# Patient Record
Sex: Male | Born: 1935 | Race: White | Hispanic: No | Marital: Married | Smoking: Never smoker
Health system: Southern US, Community
[De-identification: ages and names within clinical notes are randomized; demographics above are authoritative.]

## PROBLEM LIST (undated history)

## (undated) DIAGNOSIS — Z85828 Personal history of other malignant neoplasm of skin: Secondary | ICD-10-CM

## (undated) DIAGNOSIS — I10 Essential (primary) hypertension: Secondary | ICD-10-CM

## (undated) DIAGNOSIS — K115 Sialolithiasis: Secondary | ICD-10-CM

## (undated) DIAGNOSIS — F319 Bipolar disorder, unspecified: Secondary | ICD-10-CM

## (undated) DIAGNOSIS — M19019 Primary osteoarthritis, unspecified shoulder: Secondary | ICD-10-CM

## (undated) DIAGNOSIS — H353290 Exudative age-related macular degeneration, unspecified eye, stage unspecified: Secondary | ICD-10-CM

## (undated) DIAGNOSIS — R1310 Dysphagia, unspecified: Secondary | ICD-10-CM

## (undated) HISTORY — DX: Personal history of other malignant neoplasm of skin: Z85.828

## (undated) HISTORY — DX: Exudative age-related macular degeneration, unspecified eye, stage unspecified: H35.3290

## (undated) HISTORY — DX: Sialolithiasis: K11.5

## (undated) HISTORY — DX: Bipolar disorder, unspecified: F31.9

## (undated) HISTORY — DX: Essential (primary) hypertension: I10

## (undated) HISTORY — DX: Primary osteoarthritis, unspecified shoulder: M19.019

---

## 1966-10-17 HISTORY — PX: HEMORRHOID SURGERY: SHX153

## 2004-09-23 ENCOUNTER — Ambulatory Visit: Payer: Self-pay | Admitting: Internal Medicine

## 2004-09-30 ENCOUNTER — Ambulatory Visit: Payer: Self-pay | Admitting: Internal Medicine

## 2005-03-29 ENCOUNTER — Emergency Department (HOSPITAL_COMMUNITY): Admission: EM | Admit: 2005-03-29 | Discharge: 2005-03-29 | Payer: Self-pay | Admitting: Emergency Medicine

## 2006-02-01 ENCOUNTER — Ambulatory Visit: Payer: Self-pay | Admitting: Internal Medicine

## 2006-02-02 ENCOUNTER — Ambulatory Visit: Payer: Self-pay | Admitting: Internal Medicine

## 2006-02-08 ENCOUNTER — Ambulatory Visit: Payer: Self-pay | Admitting: Internal Medicine

## 2006-02-23 ENCOUNTER — Encounter: Payer: Self-pay | Admitting: Emergency Medicine

## 2006-02-24 ENCOUNTER — Ambulatory Visit: Payer: Self-pay | Admitting: Endocrinology

## 2006-02-24 ENCOUNTER — Inpatient Hospital Stay (HOSPITAL_COMMUNITY): Admission: EM | Admit: 2006-02-24 | Discharge: 2006-02-25 | Payer: Self-pay | Admitting: Internal Medicine

## 2006-02-25 ENCOUNTER — Ambulatory Visit: Payer: Self-pay | Admitting: *Deleted

## 2006-02-25 ENCOUNTER — Inpatient Hospital Stay (HOSPITAL_COMMUNITY): Admission: AD | Admit: 2006-02-25 | Discharge: 2006-03-07 | Payer: Self-pay | Admitting: *Deleted

## 2006-03-20 ENCOUNTER — Ambulatory Visit (HOSPITAL_COMMUNITY): Payer: Self-pay | Admitting: Psychiatry

## 2006-03-21 ENCOUNTER — Ambulatory Visit: Payer: Self-pay | Admitting: Internal Medicine

## 2006-04-05 ENCOUNTER — Ambulatory Visit (HOSPITAL_COMMUNITY): Payer: Self-pay | Admitting: Psychiatry

## 2006-04-07 ENCOUNTER — Ambulatory Visit: Payer: Self-pay | Admitting: Internal Medicine

## 2006-05-01 ENCOUNTER — Ambulatory Visit (HOSPITAL_COMMUNITY): Payer: Self-pay | Admitting: Psychiatry

## 2006-05-18 ENCOUNTER — Ambulatory Visit: Payer: Self-pay | Admitting: Internal Medicine

## 2006-06-12 ENCOUNTER — Ambulatory Visit (HOSPITAL_COMMUNITY): Payer: Self-pay | Admitting: Psychiatry

## 2006-07-17 ENCOUNTER — Ambulatory Visit (HOSPITAL_COMMUNITY): Payer: Self-pay | Admitting: Psychiatry

## 2006-07-31 ENCOUNTER — Ambulatory Visit (HOSPITAL_COMMUNITY): Payer: Self-pay | Admitting: Psychiatry

## 2006-09-25 ENCOUNTER — Ambulatory Visit (HOSPITAL_COMMUNITY): Payer: Self-pay | Admitting: Psychiatry

## 2006-10-25 ENCOUNTER — Ambulatory Visit (HOSPITAL_COMMUNITY): Payer: Self-pay | Admitting: Psychiatry

## 2006-11-15 ENCOUNTER — Ambulatory Visit (HOSPITAL_COMMUNITY): Payer: Self-pay | Admitting: Psychiatry

## 2006-12-27 ENCOUNTER — Ambulatory Visit (HOSPITAL_COMMUNITY): Payer: Self-pay | Admitting: Psychiatry

## 2007-02-28 ENCOUNTER — Ambulatory Visit (HOSPITAL_COMMUNITY): Payer: Self-pay | Admitting: Psychiatry

## 2007-03-14 ENCOUNTER — Ambulatory Visit (HOSPITAL_COMMUNITY): Payer: Self-pay | Admitting: Psychiatry

## 2007-04-25 ENCOUNTER — Ambulatory Visit (HOSPITAL_COMMUNITY): Payer: Self-pay | Admitting: Psychiatry

## 2007-06-27 ENCOUNTER — Ambulatory Visit (HOSPITAL_COMMUNITY): Payer: Self-pay | Admitting: Psychiatry

## 2007-07-16 ENCOUNTER — Encounter: Payer: Self-pay | Admitting: *Deleted

## 2007-07-16 DIAGNOSIS — F319 Bipolar disorder, unspecified: Secondary | ICD-10-CM

## 2007-07-16 DIAGNOSIS — I10 Essential (primary) hypertension: Secondary | ICD-10-CM | POA: Insufficient documentation

## 2007-08-29 ENCOUNTER — Ambulatory Visit (HOSPITAL_COMMUNITY): Payer: Self-pay | Admitting: Psychiatry

## 2007-10-24 ENCOUNTER — Ambulatory Visit (HOSPITAL_COMMUNITY): Payer: Self-pay | Admitting: Psychiatry

## 2007-12-19 ENCOUNTER — Ambulatory Visit (HOSPITAL_COMMUNITY): Payer: Self-pay | Admitting: Psychiatry

## 2009-04-18 ENCOUNTER — Ambulatory Visit: Payer: Self-pay | Admitting: Diagnostic Radiology

## 2009-04-18 ENCOUNTER — Emergency Department (HOSPITAL_BASED_OUTPATIENT_CLINIC_OR_DEPARTMENT_OTHER): Admission: EM | Admit: 2009-04-18 | Discharge: 2009-04-18 | Payer: Self-pay | Admitting: Emergency Medicine

## 2009-08-25 ENCOUNTER — Ambulatory Visit: Payer: Self-pay | Admitting: Internal Medicine

## 2009-08-27 DIAGNOSIS — M19019 Primary osteoarthritis, unspecified shoulder: Secondary | ICD-10-CM | POA: Insufficient documentation

## 2009-08-27 DIAGNOSIS — K115 Sialolithiasis: Secondary | ICD-10-CM

## 2009-09-22 ENCOUNTER — Ambulatory Visit: Payer: Self-pay | Admitting: Internal Medicine

## 2009-10-21 ENCOUNTER — Ambulatory Visit: Payer: Self-pay | Admitting: Internal Medicine

## 2010-11-16 NOTE — Assessment & Plan Note (Signed)
Summary: 2 WEEK FOLLOW UP FOR BP CHECK-LB    Vital Signs:  Patient profile:   77 year old male Height:      65 inches Weight:      125 pounds BMI:     20.88 O2 Sat:      98 % on Room air Temp:     98.3 degrees F oral Pulse rate:   76 / minute BP sitting:   168 / 78  (left arm) Cuff size:   regular  Vitals Entered By: Ami Bullins CMA (October 21, 2009 11:31 AM)  O2 Flow:  Room air CC: follow-up visit/ ab   Primary Care Provider:  Illene Regulus  CC:  follow-up visit/ ab.  History of Present Illness: For BP follow-up: December 7th he had norvasc increased to 10mg  and added furosemide 40mg . Better control  Current Medications (verified): 1)  Furosemide 40 Mg Tabs (Furosemide) .Marland Kitchen.. 1 By Mouth Once Daily - A Loop Diuretic For Blood Pressure 2)  Amlodipine Besylate 10 Mg Tabs (Amlodipine Besylate) .Marland Kitchen.. 1 By Mouth Once Daily Calcium Channel Blocker For Blood Pressure 3)  Multivitamins   Tabs (Multiple Vitamin) .... Once Daily 4)  Fa-Vitamin B-6-Vitamin B-12 2.2-25-0.5 Mg  Tabs (Folic Acid-Vit B6-Vit B12) .... Once Daily 5)  Garlic 1000 Mg  Caps (Garlic) .... Two Times A Day 6)  Cvs Zinc 50 Mg  Tabs (Zinc) .... Two Times A Day 7)  Selenium 100 Mcg  Tabs (Selenium) .... Once Daily 8)  Cvs Vitamin E 800 Unit  Caps (Vitamin E) .... Once Daily 9)  Vitamin C-Acerola 500 Mg  Chew (Ascorbic Acid) .... 2000mg  Once Daily 10)  Divalproex Sodium 500 Mg Xr24h-Tab (Divalproex Sodium) .Marland Kitchen.. 1 Tab At Bedtime  Allergies (verified): No Known Drug Allergies PMH-FH-SH reviewed-no changes except otherwise noted  Physical Exam  General:  Well-developed,well-nourished, thin,in no acute distress; alert,appropriate and cooperative throughout examination Head:  Normocephalic and atraumatic without obvious abnormalities. No apparent alopecia or balding. Lungs:  normal respiratory effort and normal breath sounds.   Heart:  normal rate and regular rhythm.     Impression & Recommendations:  Problem #  1:  HYPERTENSION (ICD-401.9)  His updated medication list for this problem includes:    Furosemide 40 Mg Tabs (Furosemide) .Marland Kitchen... 1 by mouth once daily - a loop diuretic for blood pressure    Amlodipine Besylate 10 Mg Tabs (Amlodipine besylate) .Marland Kitchen... 1 by mouth once daily calcium channel blocker for blood pressure  BP today: 168/78 Prior BP: 172/100 (09/22/2009)  Plan - continue present meds. Patient to monitor at home and report back.   Complete Medication List: 1)  Furosemide 40 Mg Tabs (Furosemide) .Marland Kitchen.. 1 by mouth once daily - a loop diuretic for blood pressure 2)  Amlodipine Besylate 10 Mg Tabs (Amlodipine besylate) .Marland Kitchen.. 1 by mouth once daily calcium channel blocker for blood pressure 3)  Multivitamins Tabs (Multiple vitamin) .... Once daily 4)  Fa-vitamin B-6-vitamin B-12 2.2-25-0.5 Mg Tabs (Folic acid-vit b6-vit b12) .... Once daily 5)  Garlic 1000 Mg Caps (Garlic) .... Two times a day 6)  Cvs Zinc 50 Mg Tabs (Zinc) .... Two times a day 7)  Selenium 100 Mcg Tabs (Selenium) .... Once daily 8)  Cvs Vitamin E 800 Unit Caps (Vitamin e) .... Once daily 9)  Vitamin C-acerola 500 Mg Chew (Ascorbic acid) .... 2000mg  once daily 10)  Divalproex Sodium 500 Mg Xr24h-tab (Divalproex sodium) .Marland Kitchen.. 1 tab at bedtime  Patient Instructions: 1)  Blood pressure -  too high in the office today. If it is running lower at home I would like a record of that: check your Blood pressure 1 or 2 times a day at different times - record those values and report the information back to me.

## 2011-01-24 LAB — BASIC METABOLIC PANEL
BUN: 16 mg/dL (ref 6–23)
Calcium: 9.6 mg/dL (ref 8.4–10.5)
Chloride: 96 mEq/L (ref 96–112)
GFR calc non Af Amer: >60 mL/min (ref >60)
Potassium: 4 mEq/L (ref 3.5–5.1)

## 2011-01-24 LAB — URINALYSIS, ROUTINE W REFLEX MICROSCOPIC
Glucose, UA: NEGATIVE mg/dL
Hgb urine dipstick: NEGATIVE
Protein, ur: NEGATIVE mg/dL
Specific Gravity, Urine: 1.006 (ref 1.005–1.030)
Urobilinogen, UA: 0.2 mg/dL (ref 0.0–1.0)

## 2011-01-24 LAB — POCT TOXICOLOGY PANEL

## 2011-01-24 LAB — CBC
HCT: 42.9 % (ref 39.0–52.0)
Hemoglobin: 14.4 g/dL (ref 13.0–17.0)
MCHC: 33.5 g/dL (ref 30.0–36.0)
MCV: 91 fL (ref 78.0–100.0)
RDW: 12.9 % (ref 11.5–15.5)

## 2011-01-24 LAB — DIFFERENTIAL
Eosinophils Absolute: 0.1 10*3/uL (ref 0.0–0.7)
Eosinophils Relative: 1 % (ref 0–5)
Lymphocytes Relative: 6 % — ABNORMAL LOW (ref 12–46)
Lymphs Abs: 0.7 10*3/uL (ref 0.7–4.0)
Monocytes Relative: 5 % (ref 3–12)

## 2011-01-24 LAB — ETHANOL: Alcohol, Ethyl (B): <5 mg/dL (ref 0–10)

## 2011-02-10 ENCOUNTER — Other Ambulatory Visit (INDEPENDENT_AMBULATORY_CARE_PROVIDER_SITE_OTHER): Payer: Medicare Other

## 2011-02-10 ENCOUNTER — Ambulatory Visit (INDEPENDENT_AMBULATORY_CARE_PROVIDER_SITE_OTHER): Payer: Medicare Other | Admitting: Internal Medicine

## 2011-02-10 ENCOUNTER — Encounter: Payer: Self-pay | Admitting: Internal Medicine

## 2011-02-10 VITALS — BP 208/82 | HR 66 | Temp 97.9°F | Wt 136.0 lb

## 2011-02-10 DIAGNOSIS — I10 Essential (primary) hypertension: Secondary | ICD-10-CM

## 2011-02-10 LAB — COMPREHENSIVE METABOLIC PANEL
ALT: 13 U/L (ref 0–53)
Albumin: 3.9 g/dL (ref 3.5–5.2)
CO2: 30 mEq/L (ref 19–32)
Calcium: 9.5 mg/dL (ref 8.4–10.5)
Chloride: 97 mEq/L (ref 96–112)
GFR: 152.17 mL/min (ref >60.00)
Glucose, Bld: 101 mg/dL — ABNORMAL HIGH (ref 70–99)
Sodium: 134 mEq/L — ABNORMAL LOW (ref 135–145)
Total Protein: 6.7 g/dL (ref 6.0–8.3)

## 2011-02-10 MED ORDER — AMLODIPINE BESYLATE 10 MG PO TABS: 10.0000 mg | ORAL_TABLET | Freq: Every day | ORAL | Status: DC

## 2011-02-10 MED ORDER — FUROSEMIDE 40 MG PO TABS: 40.0000 mg | ORAL_TABLET | Freq: Every day | ORAL | Status: DC

## 2011-02-10 NOTE — Progress Notes (Signed)
  Subjective:    Patient ID: [name], male    DOB: 1928-10-05, 77 y.o.   MRN: [id number]  HPIMr. [name] is seen acutely due to very high blood pressure at the dermatologist office 220/140. He was asymptomatic. He admits that he hasn't been taking any blood pressure medication for two + months. At home he does run a blood pressure of 150-180. By his recollection he was not having any adverse effects from the medication.   He has been treated for glaucoma with intra-orbital injections. He has also been having multiple skin lesions excised.   Past Medical History  Diagnosis Date  . History of skin cancer   . Sialolithiasis   . Osteoarthrosis, unspecified whether generalized or localized, shoulder region   . Bipolar disorder, unspecified   . Unspecified essential hypertension   . Macular degeneration, age related, exudative     revieving intra-ocular injections left eye   Past Surgical History  Procedure Date  . Hemorrhoid surgery 1968   Family History  Problem Relation Age of Onset  . Lymphoma Mother   . Colon cancer Sister   . COPD Sister   . Diabetes Neg Hx   . Hyperlipidemia Neg Hx   . Heart disease Neg Hx   . Cancer Sister     colon cancer   History   Social History  . Marital Status: Married    Spouse Name: N/A    Number of Children: 1  . Years of Education: 16   Occupational History  . Programmer, systems   Social History Main Topics  . Smoking status: Never Smoker   . Smokeless tobacco: Not on file  . Alcohol Use: 3.5 oz/week    7 drink(s) per week  . Drug Use: Not on file  . Sexually Active: No   Other Topics Concern  . Not on file   Social History Narrative   [employer]. College grad-[employer] [place]. Married '52. 1 son - '65.  Retired.End of Life issues - provided packet today (02/10/11)       Review of Systems Review of Systems  Constitutional:  Negative for fever, chills, activity change and unexpected weight change.    HENT:  Negative for hearing loss, ear pain, congestion, neck stiffness and postnasal drip.   Eyes: Negative for pain, discharge and visual disturbance.  Respiratory: Negative for chest tightness and wheezing.   Cardiovascular: Negative for chest pain and palpitations.       [No decreased exercise tolerance Gastrointestinal: [No change in bowel habit. No bloating or gas. No reflux or indigestion Genitourinary: Negative for urgency, frequency, flank pain and difficulty urinating.  Musculoskeletal: Negative for myalgias, back pain, arthralgias and gait problem.  Neurological: Negative for dizziness, tremors, weakness and headaches.  Hematological: Negative for adenopathy.  Psychiatric/Behavioral: Negative for behavioral problems and dysphoric mood.       Objective:   Physical Exam Elderly white male in no distress HEENT - Noble/at, C&S clear Chest clear to AP Cor - RRR, no carotid bruits Neuro - non-focal.       Assessment & Plan:  1. Hypertension - patient with out of control hypertension but he is asymptomatic.  Plan - resume medications: amlodipine 10mg  qd and furosemide 40mg            Return for BP check.

## 2011-02-11 ENCOUNTER — Encounter: Payer: Self-pay | Admitting: Internal Medicine

## 2011-03-04 NOTE — H&P (Signed)
[name], [name] NO.:  [id number]   MEDICAL RECORD NO.:  [id number]          PATIENT TYPE:  EMS   LOCATION:  ED                           FACILITY:  [facility]   PHYSICIAN:  Rod Holler, MD      DATE OF BIRTH:  03-Nov-1927   DATE OF ADMISSION:  02/23/2006  DATE OF DISCHARGE:                                HISTORY & PHYSICAL   CHIEF COMPLAINT:  Not sleeping well.   HISTORY OF PRESENT ILLNESS:  [name] is a 77 year old male with a history  of hypertension, possible bipolar disorder, who presented to the ED  reportedly with delusions and paranoid behavior.  By his report, he is under  a lot of stressors, not sleeping well, not eating well.  The patient states  he is not homicidal or suicidal but state that someone is trying to hurt  him.  He denies audible or visual hallucinations.  In the emergency  department, the patient would not take any p.o. medications.  He was trying  to leave the hospital against medical advice, was restrained and committed  to the hospital for psychiatric evaluation.   In the emergency department, the patient had a CT scan of the head with no  acute findings but did have evidence of small vessel disease, chronic  sinusitis as well.  In the emergency department, the patient was given  Ativan.  Initial blood pressure were 199/102 and he was given labetalol and  a Clonidine patch.  His blood pressure now is 150/90s.  The patient has no  complaints of chest pain or shortness of breath.   PAST MEDICAL HISTORY:  1.  Hypertension.  2.  Bipolar disorder.   MEDICATIONS:  None.   ALLERGIES:  No known drug allergies.   SOCIAL HISTORY:  The patient lives in [place].  He is a Psychologist, occupational.  No  tobacco use.   FAMILY HISTORY:  Mother died of cancer.  Father died of old age.   REVIEW OF SYSTEMS:  All systems reviewed in detail and are negative except  as noted in the history of present illness.   PHYSICAL EXAMINATION:  VITAL SIGNS:  Blood  pressure 150s/90s, heart rate in  the 70s, respiratory rate 20, temperature 99.0, oxygen saturation 98% on  room air.  GENERAL:  Thin, elderly male, alert and oriented x3, in no acute respiratory  distress.  HEENT:  Normocephalic and atraumatic.  Pupils are equal, round, and reactive  to M[place].  Extraocular movements intact.  Oropharynx clear.  NECK:  Supple.  No adenopathy, no JVD, no carotid bruits.  CHEST:  Lungs clear to auscultation bilaterally with equal bilateral breath  sounds.  CORONARY:  Regular rhythm, normal rate, normal S1 and S2.  No murmurs,  gallops, or rubs.  2+ peripheral pulses.  ABDOMEN:  Soft, nontender, and nondistended.  Active bowel sounds.  No  hepatosplenomegaly.  EXTREMITIES:  No clubbing, cyanosis, or edema.  NEUROLOGICAL:  No focal deficits.  SKIN:  No rashes.  PSYCHIATRIC:  The patient is alert and oriented.  Moves all extremities  well.  Has a verbal stream of consciousness.   LABORATORY DATA:  White blood cell count of 5.4, hematocrit 40.2, platelets  283,000.  Sodium 134, potassium 3.2, chloride 97, bicarb 25, BUN 14,  creatinine 0.5, glucose 153.   IMPRESSION:  [name] is a 77 year old male who presents with psychosis,  along with hypertension.   PLAN:  1.  Psychiatric:  The patient has already been committed for psychiatric      evaluation.  We will get a sitter at the bedside and give him Ativan or      Haldol p.r.n.  2.  Cardiovascular:  EKG in the morning, Clonidine patch 0.1 mg, hydralazine      p.r.n.  3.  Endocrine:  Thyroid function tests in the morning.  Hemoglobin A1c in      the morning.  4.  CMP in the morning.  5.  Fluids/electrolytes/nutrition:  The patient has had a potassium      repletion in the emergency department.  We will recheck potassium in the      morning, place the patient on a regular diet.      Rod Holler, MD  Electronically Signed     TRK/MEDQ  D:  02/23/2006  T:  02/23/2006  Job:  [id number]
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
## 2011-03-04 NOTE — Discharge Summary (Signed)
[name], [name] NO.:  [id number]   MEDICAL RECORD NO.:  [id number]          PATIENT TYPE:  EMS   LOCATION:  ED                           FACILITY:  [facility]   PHYSICIAN:  Sean A. Everardo All, M.D. [facility] OF BIRTH:  1928/06/21   DATE OF ADMISSION:  02/23/2006  DATE OF DISCHARGE:  02/23/2006                                 DISCHARGE SUMMARY   REASON FOR ADMISSION:  Paranoia.   HISTORY OF PRESENT ILLNESS:  This 77 year old man admitted by Dr. Samuel Bouche on  Feb 23, 2006.  Please refer to his dictated history and physical for  details.   HOSPITAL COURSE:  The patient was admitted and blood pressure medication was  started. His potassium was repleted. He had laboratory studies including  thyroid function studies which are normal. He was evaluated by psychiatry  who felt that he was not competent and that he was an immediate threat to  himself and/or others. I have personally seen and examined him today.  On  examination today, he demands to know why a call button [place] is on in his  room. He also refused to shake my hand, saying he was contaminated. His  wife describes him as being extremely depressed and detached from reality  as well as paranoid.  I am thus certifying in the medical record that he is  incompetent to make medical decisions and is an immediate threat to himself  and/or others. He is currently being evaluated for a transfer to [facility]   MEDICATIONS AT TIME OF TRANSFER:  1.  Catapres patch - TTS - one weekly.  2.  Haldol 2 to 5 mg p.o. or IM every 4 hours p.r.n. acute agitation.  3.  Ativan 1 to 2 mg p.o. or IM or IV every 4 hours p.r.n. for agitation.  4.  Cogentin 2 mg IV b.i.d. p.r.n. for acute muscle stiffness (acute      dystonia).  5.  Zyprexa 5 mg p.o. or IM q.h.s.   DIET:  No restriction on diet.   ACTIVITY:  Is ad lib except he must stay in his room and he has a Comptroller.           ______________________________  Cleophas Dunker. Everardo All, M.D. [facility]     SAE/MEDQ  D:  02/24/2006  T:  02/25/2006  Job:  [id number]

## 2011-03-04 NOTE — Discharge Summary (Signed)
NAMEJERRIT, [name] NO.:  [id number]   MEDICAL RECORD NO.:  [id number]          PATIENT TYPE:  IPS   LOCATION:  0403                          FACILITY:  [facility]   PHYSICIAN:  Jasmine Pang, M.D. DATE OF BIRTH:  1928-09-20   DATE OF ADMISSION:  02/25/2006  DATE OF DISCHARGE:  03/07/2006                                 DISCHARGE SUMMARY   IDENTIFICATION:  This was an involuntary admission to my service for this 77-  year-old married Caucasian male.  He was admitted on Feb 25, 2006.   HISTORY OF PRESENT ILLNESS:  The patient's wife brought him to the emergency  department at [facility] yesterday.  The patient stated he was  depressed.  He had not slept in weeks and he thought someone was going to  kill him.  He was noted to be very paranoid.  He was attempted to be  medically cleared.  However, his blood pressure was elevated.  His potassium  was decreased and hence he was kept overnight.  He had to be restrained due  to refusing all medical treatment.  He was given IV potassium and Ativan 1  mg as well as his blood pressure medications.  She was transferred over to  the [facility] as it was felt he was medically stable.  On the  day of admission, he was appearing quite paranoid.  Apparently, there had  been some work done in his home.  He got into a confrontation with the  contractor.  He believed that bombs had been planted in his home in vehicles  by the construction workers he hired.  He had not been eating because he  thought the food was poisoned and apparently his wife reported he had been  impulsively spending excessive amounts of money.  The patient was noted to  have a watchful guarded affect.  He was peeping out of the room and down the  hall.  He stated he was in the hospital because of my blood pressure.   Upon admission, the patient was alert but fearful.  He had poor eye contact.  His appearance was somewhat unkempt.  He looked  like he was malnourished.  His speech was not pressured.  His mood was anxious but somewhat labile.  His thought processes were concrete.  Cognitive exam:  Memory was good.  His  concentration was a little bit decreased.  He denies responding to internal  stimuli, however, his behavior would suggest otherwise.  He reported  suicidal ideation with no plan.  However, he denied that on the day of  admission.  He has never had homicidal ideation.  On the day before  admission, he reported that booger people were watching him and his  family.  He was not eating because he thought the food was poisoned.  He  also acknowledged that he had not slept in two minutes.  The admission  diagnosis was psychotic disorder not otherwise specified.   SOCIAL HISTORY:  The patient graduated [employer] in 1959.  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  He has been married once for 50 years.  He currently is  a Careers adviser.  He functions at a very high level.   FAMILY HISTORY:  The patient denies.   SUBSTANCE ABUSE HISTORY:  The patient denies smoking and drinking.   PRIMARY CARE PHYSICIAN:  Dr. Debby Bud.   MEDICAL HISTORY:  The patient takes calcium channel blocker for his  hypertension.  He cannot recall the name.   ALLERGIES:  No known drug allergies.   PHYSICAL EXAMINATION:  The patient's vital signs on admission to the unit  revealed that he was 5 feet 7 inches.  He weighed 118 pounds.  His  temperature was 99.3.  His blood pressure 198/102 manually, sitting it was  173/89 and 144/91, pulse was about 108, respirations 20.  His physical  examination is well-documented in the chart since he has been admitted to  the [facility].   LABORATORY DATA:  The patient's labs were done at [facility].  His  potassium was only 3.2 and hence he received some IV potassium.  His CT scan  showed mild cortico atrophy and moderate changes.   HOSPITAL COURSE:  Upon admission, the patient was continued  on Norvasc 5 mg  q.d.  He was also started on Zyprexa Zydis 5 mg now and then 1 p.o. b.i.d.  A nutritional consult was ordered for his decreased 40 pounds and the  dietician saw him with recommendations for supplemental nutritional drinks  (the patient like Boost).  The patient was also ordered Ensure Plus b.i.d.  between meals and a multivitamin daily.  It was also written that patient  may have visitors outside of visitation hours because he did much better  with his wife and family.  On Mar 01, 2006, the patient was very agitated.  He received Zyprexa 5 mg IM x1.  On Mar 04, 2006, the patient was started on  Haldol Decanoate 100 mg IM because of his refusal to take Zyprexa.  He was  also ordered Cogentin 1 mg p.o. b.i.d. for muscle stiffness.  On Mar 07, 2006 before discharge, the patient was ordered a home health R.N. as well as  an aide from [facility].  The patient tolerated these  medications well with no significant side effects.   I first met the patient on Feb 25, 2006.  He felt he was mentally  interactive with me and somewhat guarded and reserved.  He continued to be  this way through the weekend.  He was very delusional, guarded and paranoid,  nonspontaneous with thought blocking and thought disorganization.  The  patient's casemanager had a long conversation with the patient's wife  regarding symptoms.  The patient's wife was worried about the patient as he  is continually declined over the past year.  Per wife, symptoms began about  a year ago.  The patient was not sleeping, had increased energy, was  impulsive and out of character behavior.  The patient was told by PCP that  he may have bipolar disorder secondary to manic-like symptoms.  The patient  and his wife went to a bipolar disorder support group but he refused to go  back, stating these people are depressed and I'm happy; I need to be around happy people.  Per wife, over the last few months, his  symptoms have  worsened to paranoid, bizarre behavior.  He was not eating well and refusing  meds secondary to the belief that he  was being poisoned.  His wife states  that he does not trust anyone and had to be restrained on the medical unit.  According to wife's description, the patient had no bizarre or out of  ordinary behavior one year ago.   On Mar 01, 2006, the patient was lying in his bed when I talked with him.  He responded to me slightly better but then stopped talking.  He had no eye  contact, severe psychomotor retardation.  He was delusional about the  Zyprexa pills but did allow the staff to do a Zyprexa shot.  He had refused  Zyprexa the night before.  On Mar 02, 2006, the patient continued to be very  delusional.  He refused to take his medicines that day.  He spoke to me  briefly when I saw him but then stopped.  On Mar 03, 2006, there was no  significant change in mental status.  He was still very delusional.  However, he had taken his antipsychotic for two doses.  Over the weekend, he  was seen by Vic Ripper, our PA, due to his refusal to take his  medications.  She started Haldol Decanoate 100 mg IM and Cogentin 1 mg p.o.  b.i.d. p.r.n. EPS.  On Mar 06, 2006, the patient presented as still paranoid  and guarded.  He did however take his medicine.  It was planned that his  wife and family will meet with [place] for a treatment team plan to discuss  discharge plans.  She wants him to go home and feels strongly that he would  do better at home than on the unit where he is paranoid.   On Mar 07, 2006, the family came here to request discharge.  We had a  treatment team meeting with them.  We met with Mr. Millette and his wife and  his son today along with other treatment team members including Lynnea Maizes,  Britta Mccreedy Marksamer and Garnette Czech.  His mental status had improved  especially around family.  He still had poor eye contact with psychomotor  retardation.  It was  better with his wife.  Speech was soft and slow, some  blocking though again better with his wife.  Mood was depressed but  improved.  Affect constricted.  No suicidal or homicidal ideation.  No  auditory or visual hallucinations, paranoia much improved except still  somewhat paranoid about the medicines.  However, he has been agreeing to  take them in the hospital and he agreed to take them if he goes home.  Thoughts revealed some thought blocking but more linear than upon admission.  Cognitive exam was still difficult to assess due to his mental status.  It  was decided in our treatment team meeting that the family would attempt to  take Mr. Severin home and watch him round-the-clock.  I ordered an R.N. and  aide from [facility] to assist with the caretaking of Mr. Haydon.  We also planned for him to go to IOP for at least episodically to be able to  see a doctor soon who can monitor him.  DISCHARGE DIAGNOSES:  AXIS I:  Bipolar disorder, single episode, severe with  psychosis.  Rule out a dementia.  AXIS II:  None.  AXIS III:  Hypertension.  AXIS IV:  Severe (has taken on too many work activities according to wife,  conflict with a Games developer who is working on his house).  AXIS V:  GAF upon admission  25; GAF highest past year 61; GAF at the time of  discharge 35.   ACTIVITY/DIET:  The patient had no specific dietary restrictions.  It was  recommended that he walk with assistance or have family near him due to his  unsteady gait.   DISCHARGE MEDICATIONS:  1.  Zyprexa 10 mg p.o. at 9 p.m.  2.  Norvasc 5 mg daily.   POST-HOSPITAL CARE PLANS:  The patient will be in the IOP program starting  Friday March 21, 2006 at 8:30 a.m. After this program is over, he will see Dr.  Lolly Mustache for follow-up med management on March 20, 2006 at 1:30 p.m.      Jasmine Pang, M.D.  Electronically Signed     BHS/MEDQ  D:  03/07/2006  T:  03/07/2006  Job:  [id number]

## 2012-02-10 ENCOUNTER — Other Ambulatory Visit: Payer: Self-pay | Admitting: Internal Medicine

## 2012-04-11 ENCOUNTER — Ambulatory Visit (INDEPENDENT_AMBULATORY_CARE_PROVIDER_SITE_OTHER): Payer: Medicare Other | Admitting: Emergency Medicine

## 2012-04-11 VITALS — BP 173/71 | HR 69 | Temp 97.8°F | Resp 18 | Ht 68.0 in | Wt 137.0 lb

## 2012-04-11 DIAGNOSIS — T148XXA Other injury of unspecified body region, initial encounter: Secondary | ICD-10-CM

## 2012-04-11 DIAGNOSIS — L821 Other seborrheic keratosis: Secondary | ICD-10-CM

## 2012-04-11 DIAGNOSIS — W57XXXA Bitten or stung by nonvenomous insect and other nonvenomous arthropods, initial encounter: Secondary | ICD-10-CM

## 2012-04-11 NOTE — Progress Notes (Signed)
Patient Name: [name] Date of Birth: 04-11-1928 Medical Record Number: [id number] Gender: male Date of Encounter: 04/11/2012  History of Present Illness:  [name] is a 78 y.o. very pleasant male patient who presents with the following:  Has history of frequent tick exposure, removing several ticks from his skin past week.  No rash, illness, fever or chills.  Other complaints   Patient Active Problem List  Diagnosis  . DISORDER, BIPOLAR NOS  . HYPERTENSION  . SIALOLITHIASIS  . OSTEOARTHRITIS, SHOULDER, RIGHT   Past Medical History  Diagnosis Date  . History of skin cancer   . Sialolithiasis   . Osteoarthrosis, unspecified whether generalized or localized, shoulder region   . Bipolar disorder, unspecified   . Unspecified essential hypertension   . Macular degeneration, age related, exudative     revieving intra-ocular injections left eye   Past Surgical History  Procedure Date  . Hemorrhoid surgery 1968   History  Substance Use Topics  . Smoking status: Never Smoker   . Smokeless tobacco: Not on file  . Alcohol Use: 3.5 oz/week    7 drink(s) per week   Family History  Problem Relation Age of Onset  . Lymphoma Mother   . Colon cancer Sister   . COPD Sister   . Diabetes Neg Hx   . Hyperlipidemia Neg Hx   . Heart disease Neg Hx   . Cancer Sister     colon cancer   No Known Allergies  Medication list has been reviewed and updated.  Prior to Admission medications   Medication Sig Start Date End Date Taking? Authorizing Provider  amLODipine (NORVASC) 10 MG tablet TAKE 1 TABLET BY MOUTH EVERY DAY FOR BLOOD PRESSURE 02/10/12   Jacques Navy, MD  Ascorbic Acid (VITAMIN C-ACEROLA) 500 MG CHEW Chew 2,000 mg by mouth daily.      Historical Provider, MD  Cholecalciferol 1000 UNITS tablet Take 1,000 Units by mouth daily.      Historical Provider, MD  CVS Zinc 50 MG TABS Take 1 tablet by mouth 2 (two) times daily.      Historical Provider, MD    divalproex (DEPAKOTE) 500 MG 24 hr tablet Take 500 mg by mouth at bedtime.      Historical Provider, MD  Folic Acid-Vit B6-Vit B12 2.2-25-0.5 MG TABS Take 1 tablet by mouth daily.      Historical Provider, MD  furosemide (LASIX) 40 MG tablet Take 1 tablet (40 mg total) by mouth daily. A diuretic for BP 02/10/11   Jacques Navy, MD  Garlic 1000 MG CAPS Take by mouth 2 (two) times daily.      Historical Provider, MD  Glucosamine 500 MG TABS Take by mouth daily.      Historical Provider, MD  Multiple Vitamins-Minerals (MULTIVITAMIN,TX-MINERALS) tablet Take 1 tablet by mouth daily.      Historical Provider, MD  Selenium 100 MCG TABS Take by mouth daily.      Historical Provider, MD  vitamin A 40981 UNIT capsule Take by mouth daily.      Historical Provider, MD  vitamin E 800 UNIT capsule Take 800 Units by mouth daily.      Historical Provider, MD    Review of Systems:  As per HPI, otherwise negative.    Physical Examination: Filed Vitals:   04/11/12 1047  BP: 173/71  Pulse: 69  Temp: 97.8 F (36.6 C)  Resp: 18   Filed Vitals:   04/11/12 1047  Height: 5\' 8"  (1.727 m)  Weight: 137 lb (62.143 kg)   Body mass index is 20.83 kg/(m^2). Ideal Body Weight: Weight in (lb) to have BMI = 25: 164.1   GEN: WDWN, NAD, Non-toxic, A & O x 3 HEENT: Atraumatic, Normocephalic. Neck supple. No masses, No LAD. Ears and Nose: No external deformity. CV: RRR, No M/G/R. No JVD. No thrill. No extra heart sounds. PULM: CTA B, no wheezes, crackles, rhonchi. No retractions. No resp. distress. No accessory muscle use. ABD: S, NT, ND, +BS. No rebound. No HSM. EXTR: No c/c/e NEURO Normal gait.  PSYCH: Normally interactive. Conversant. Not depressed or anxious appearing.  Calm demeanor.  Skin;  Multiple seborrheic keratosis.  Assessment and Plan: Non malignant skin lesions Tick exposure Recommend follow up with dermatologist Use DEET  Carmelina Dane, MD

## 2013-07-18 ENCOUNTER — Observation Stay (HOSPITAL_COMMUNITY)
Admission: EM | Admit: 2013-07-18 | Discharge: 2013-07-19 | Disposition: A | Discharge status: Home / Self Care | Payer: Medicare Other | Attending: General Surgery | Admitting: General Surgery

## 2013-07-18 ENCOUNTER — Emergency Department (HOSPITAL_COMMUNITY): Payer: Medicare Other

## 2013-07-18 ENCOUNTER — Encounter (HOSPITAL_COMMUNITY): Payer: Self-pay | Admitting: *Deleted

## 2013-07-18 DIAGNOSIS — S0083XA Contusion of other part of head, initial encounter: Secondary | ICD-10-CM

## 2013-07-18 DIAGNOSIS — S0180XA Unspecified open wound of other part of head, initial encounter: Principal | ICD-10-CM | POA: Insufficient documentation

## 2013-07-18 DIAGNOSIS — D62 Acute posthemorrhagic anemia: Secondary | ICD-10-CM

## 2013-07-18 DIAGNOSIS — S0181XA Laceration without foreign body of other part of head, initial encounter: Secondary | ICD-10-CM

## 2013-07-18 DIAGNOSIS — R58 Hemorrhage, not elsewhere classified: Secondary | ICD-10-CM

## 2013-07-18 DIAGNOSIS — I10 Essential (primary) hypertension: Secondary | ICD-10-CM | POA: Insufficient documentation

## 2013-07-18 DIAGNOSIS — H548 Legal blindness, as defined in USA: Secondary | ICD-10-CM | POA: Insufficient documentation

## 2013-07-18 DIAGNOSIS — Z79899 Other long term (current) drug therapy: Secondary | ICD-10-CM | POA: Insufficient documentation

## 2013-07-18 DIAGNOSIS — W010XXA Fall on same level from slipping, tripping and stumbling without subsequent striking against object, initial encounter: Secondary | ICD-10-CM | POA: Insufficient documentation

## 2013-07-18 DIAGNOSIS — H353290 Exudative age-related macular degeneration, unspecified eye, stage unspecified: Secondary | ICD-10-CM | POA: Insufficient documentation

## 2013-07-18 LAB — COMPREHENSIVE METABOLIC PANEL
ALT: 11 U/L (ref 0–53)
AST: 18 U/L (ref 0–37)
Albumin: 3.5 g/dL (ref 3.5–5.2)
Calcium: 8.1 mg/dL — ABNORMAL LOW (ref 8.4–10.5)
Creatinine, Ser: 0.61 mg/dL (ref 0.50–1.35)
GFR calc non Af Amer: >90 mL/min (ref >90)
Sodium: 130 mEq/L — ABNORMAL LOW (ref 135–145)
Total Protein: 6.6 g/dL (ref 6.0–8.3)

## 2013-07-18 LAB — CBC
HCT: 31.5 % — ABNORMAL LOW (ref 39.0–52.0)
Hemoglobin: 11.1 g/dL — ABNORMAL LOW (ref 13.0–17.0)
MCH: 29.2 pg (ref 26.0–34.0)
MCV: 82.9 fL (ref 78.0–100.0)
RBC: 3.8 MIL/uL — ABNORMAL LOW (ref 4.22–5.81)

## 2013-07-18 LAB — PROTIME-INR: Prothrombin Time: 14.2 seconds (ref 11.6–15.2)

## 2013-07-18 LAB — POCT I-STAT, CHEM 8
Glucose, Bld: 147 mg/dL — ABNORMAL HIGH (ref 70–99)
HCT: 35 % — ABNORMAL LOW (ref 39.0–52.0)
Hemoglobin: 11.9 g/dL — ABNORMAL LOW (ref 13.0–17.0)
Potassium: 3.9 mEq/L (ref 3.5–5.1)

## 2013-07-18 LAB — TYPE AND SCREEN: Antibody Screen: NEGATIVE

## 2013-07-18 LAB — ABO/RH: ABO/RH(D): A NEG

## 2013-07-18 MED ORDER — LABETALOL HCL 5 MG/ML IV SOLN
5.0000 mg | Freq: Once | INTRAVENOUS | Status: AC
  Administered 2013-07-18: 5 mg via INTRAVENOUS
  Filled 2013-07-18: qty 4

## 2013-07-18 NOTE — ED Notes (Signed)
Patient wallet and 451.00 cash placed in valuable envelope and given to security.  Count verified by [place] G, RN.

## 2013-07-18 NOTE — ED Notes (Signed)
Pt's family at bedside. Resident and RN updated family on plan of care.

## 2013-07-18 NOTE — ED Notes (Signed)
Patient found lying on sidewalk in pool of blood, patient with laceration to right forehead, patient bleeding controlled with pressure

## 2013-07-18 NOTE — ED Provider Notes (Signed)
CSN: [id number]     Arrival date & time 07/18/13  1812 History   First MD Initiated Contact with Patient 07/18/13 1825     Chief Complaint  Patient presents with  . Fall  . Head Laceration   (Consider location/radiation/quality/duration/timing/severity/associated sxs/prior Treatment) Patient is a 79 y.o. male presenting with fall.  Fall This is a new problem. The current episode started today. Episode frequency: once. The problem has been unchanged. Pertinent negatives include no abdominal pain, anorexia, arthralgias, chest pain, chills, congestion, coughing, fever, headaches, nausea, rash, sore throat, vomiting or weakness. Nothing aggravates the symptoms. He has tried nothing for the symptoms.    Past Medical History  Diagnosis Date  . History of skin cancer   . Sialolithiasis   . Osteoarthrosis, unspecified whether generalized or localized, shoulder region   . Bipolar disorder, unspecified   . Unspecified essential hypertension   . Macular degeneration, age related, exudative     revieving intra-ocular injections left eye   Past Surgical History  Procedure Laterality Date  . Hemorrhoid surgery  1968   Family History  Problem Relation Age of Onset  . Lymphoma Mother   . Colon cancer Sister   . COPD Sister   . Diabetes Neg Hx   . Hyperlipidemia Neg Hx   . Heart disease Neg Hx   . Cancer Sister     colon cancer   History  Substance Use Topics  . Smoking status: Never Smoker   . Smokeless tobacco: Not on file  . Alcohol Use: 3.5 oz/week    7 drink(s) per week    Review of Systems  Constitutional: Negative for fever and chills.  HENT: Negative for congestion, sore throat and rhinorrhea.   Eyes: Negative for photophobia and visual disturbance.  Respiratory: Negative for cough and shortness of breath.   Cardiovascular: Negative for chest pain and leg swelling.  Gastrointestinal: Negative for nausea, vomiting, abdominal pain, diarrhea, constipation and anorexia.    Endocrine: Negative for polydipsia and polyuria.  Genitourinary: Negative for dysuria and hematuria.  Musculoskeletal: Negative for back pain and arthralgias.  Skin: Negative for color change and rash.  Neurological: Negative for dizziness, syncope, weakness, Kirker-headedness and headaches.  Hematological: Negative for adenopathy. Does not bruise/bleed easily.  All other systems reviewed and are negative.    Allergies  Review of patient's allergies indicates no known allergies.  Home Medications   No current outpatient prescriptions on file. BP 139/44  Pulse 67  Temp(Src) 98.3 F (36.8 C) (Oral)  Resp 18  Ht 5\' 7"  (1.702 m)  Wt 139 lb 6.4 oz (63.231 kg)  BMI 21.83 kg/m2  SpO2 97% Physical Exam  Vitals reviewed. Constitutional: He is oriented to person, place, and time. He appears well-developed and well-nourished.  HENT:  Head: Normocephalic. Head is with laceration.    Eyes: Conjunctivae and EOM are normal.  Neck: Normal range of motion. Neck supple.  Cardiovascular: Normal rate, regular rhythm and normal heart sounds.   Pulmonary/Chest: Effort normal and breath sounds normal. No respiratory distress.  Abdominal: He exhibits no distension. There is no tenderness. There is no rebound and no guarding.  Musculoskeletal: Normal range of motion.  Neurological: He is alert and oriented to person, place, and time.  Skin: Skin is warm and dry.    ED Course  LACERATION REPAIR Date/Time: 07/19/2013 9:26 AM Performed by: Noel Gerold Authorized by: Noel Gerold Consent: The procedure was performed in an emergent situation. Body area: head/neck Location details: forehead Laceration length: 2  cm Tendon involvement: none Nerve involvement: none Vascular damage: yes Anesthesia: local infiltration Local anesthetic: lidocaine 1% with epinephrine Anesthetic total: 15 ml Irrigation solution: saline Debridement: none Skin closure: 3-0 nylon Subcutaneous closure: 3-0  Vicryl Number of sutures: 4 Technique: simple (with single figure of eight) Approximation: close Approximation difficulty: complex Dressing: antibiotic ointment and 4x4 sterile gauze Patient tolerance: Patient tolerated the procedure well with no immediate complications. Comments: Performed for control of arterial bleeding, not standard approximation and limited cosmesis likely to result.  Informed pt and family of likelihood.  Hemostasis obtained.   (including critical care time) Labs Review Labs Reviewed  COMPREHENSIVE METABOLIC PANEL - Abnormal; Notable for the following:    Sodium 130 (*)    Glucose, Bld 145 (*)    Calcium 8.1 (*)    All other components within normal limits  CBC - Abnormal; Notable for the following:    WBC 12.7 (*)    RBC 3.80 (*)    Hemoglobin 11.1 (*)    HCT 31.5 (*)    All other components within normal limits  CBC - Abnormal; Notable for the following:    RBC 3.16 (*)    Hemoglobin 9.3 (*)    HCT 26.4 (*)    All other components within normal limits  POCT I-STAT, CHEM 8 - Abnormal; Notable for the following:    Sodium 131 (*)    Glucose, Bld 147 (*)    Calcium, Ion 1.10 (*)    Hemoglobin 11.9 (*)    HCT 35.0 (*)    All other components within normal limits  PROTIME-INR  TYPE AND SCREEN  ABO/RH   Imaging Review Ct Head Wo Contrast  07/18/2013   CLINICAL DATA:  Larey Seat. Found down.  EXAM: CT HEAD WITHOUT CONTRAST  TECHNIQUE: Contiguous axial images were obtained from the base of the skull through the vertex without intravenous contrast.  COMPARISON:  02/23/2006.  FINDINGS: There is a large right frontal scalp hematoma and laceration but no underlying skull fracture. No foreign body.  Stable 2 slightly progressive periventricular white matter disease. Stable cerebral atrophy and mild associated ventriculomegaly. No extra-axial fluid collections are identified. The no CT findings for hemispheric infarction an or intracranial hemorrhage. No mass lesions.  The brainstem and cerebellum are grossly normal and stable.  The paranasal sinuses demonstrate chronic right-sided maxillary and ethmoid sinus disease. . The globes are intact.  IMPRESSION: Large right frontal scalp hematoma and laceration without underlying skull fracture.  No acute intracranial findings.   Electronically Signed   By: Loralie Champagne M.D.   On: 07/18/2013 19:20   Ct Cervical Spine Wo Contrast  07/18/2013   CLINICAL DATA:  Found down.  EXAM: CT CERVICAL SPINE WITHOUT CONTRAST  TECHNIQUE: Multidetector CT imaging of the cervical spine was performed without intravenous contrast. Multiplanar CT image reconstructions were also generated.  COMPARISON:  None.  FINDINGS: Moderate degenerative cervical spondylosis with multilevel disc disease and facet disease. No acute fracture is identified. The skullbase C1 and C1-2 articulations are maintained. Moderate C1-2 degenerative changes. The facets are normally aligned. No facet or laminar fractures. No abnormal prevertebral soft tissue swelling. There is multilevel bony foraminal stenosis due to facet disease and uncinate spurring.  Extensive carotid artery calcifications are noted.  IMPRESSION: Degenerative cervical spondylosis but no acute fracture.   Electronically Signed   By: Loralie Champagne M.D.   On: 07/18/2013 19:15   Dg Chest Portable 1 View  07/19/2013   CLINICAL DATA:  Larey Seat.  EXAM: PORTABLE CHEST - 1 VIEW  COMPARISON:  04/18/2009  FINDINGS: The cardiac silhouette, mediastinal and hilar contours are within normal limits and stable. There is mild tortuosity and calcification of the thoracic aorta. Mild stable emphysematous changes. The lungs are clear. No pleural effusion. The bony thorax is intact.  IMPRESSION: No acute cardiopulmonary findings.   Electronically Signed   By: Loralie Champagne M.D.   On: 07/19/2013 00:30   Ct Maxillofacial Wo Cm  07/18/2013   CLINICAL DATA:  Larey Seat. Facial trauma.  EXAM: CT MAXILLOFACIAL WITHOUT CONTRAST   TECHNIQUE: Multidetector CT imaging of the maxillofacial structures was performed. Multiplanar CT image reconstructions were also generated. A small metallic BB was placed on the right temple in order to reliably differentiate right from left.  COMPARISON:  None.  FINDINGS: No acute facial bone fractures are identified. There is chronic right maxillary sinus disease and scattered right ethmoid sinus disease. The right frontal sinus is partially opacified. The mastoid air cells and middle ear cavities are clear. The mandibular condyles are normally located. Severe degenerative changes involving both joints. No mandible fracture.  IMPRESSION: No acute facial bone fractures.  Paranasal sinus disease.   Electronically Signed   By: Loralie Champagne M.D.   On: 07/18/2013 19:18    Date: 07/19/2013  Rate: 96  Rhythm: normal sinus rhythm  QRS Axis: normal  Intervals: normal  ST/T Wave abnormalities: normal  Conduction Disutrbances: none  Narrative Interpretation: Normal sinus rhythm without acute st changes  Old EKG Reviewed: No significant changes noted    MDM   1. Traumatic hematoma of forehead, initial encounter   2. Laceration of forehead, initial encounter   3. Mesenteric arterial bleeding   4. Acute blood loss anemia    79 y.o. male  with pertinent PMH of bipolar do, macular degeneration presents with arterial bleed from forehead laceration from falling.  Initially unable to obtain full history as pt was perseverating and repeating questions, however via EMS, and later pt, report pt is legally blind and tripped, landing on concrete.  He was found on a pool of blood on the sidewalk, likely LOC with unknown duration.  CT and labwork as above unremarkable.  Laceration repaired as above.  Consulted trauma for admission.  .    Labs and imaging as above reviewed by myself and attending,Dr. Wilkie Aye, with whom case was discussed.   1. Traumatic hematoma of forehead, initial encounter   2. Laceration of  forehead, initial encounter   3. Mesenteric arterial bleeding   4. Acute blood loss anemia         Noel Gerold, MD 07/19/13 [phone]

## 2013-07-19 ENCOUNTER — Emergency Department (HOSPITAL_COMMUNITY): Payer: Medicare Other

## 2013-07-19 DIAGNOSIS — S0180XA Unspecified open wound of other part of head, initial encounter: Secondary | ICD-10-CM

## 2013-07-19 DIAGNOSIS — S0083XA Contusion of other part of head, initial encounter: Secondary | ICD-10-CM

## 2013-07-19 DIAGNOSIS — S0003XA Contusion of scalp, initial encounter: Secondary | ICD-10-CM

## 2013-07-19 DIAGNOSIS — S1093XA Contusion of unspecified part of neck, initial encounter: Secondary | ICD-10-CM

## 2013-07-19 DIAGNOSIS — D62 Acute posthemorrhagic anemia: Secondary | ICD-10-CM

## 2013-07-19 LAB — CBC
HCT: 26.4 % — ABNORMAL LOW (ref 39.0–52.0)
Hemoglobin: 9.3 g/dL — ABNORMAL LOW (ref 13.0–17.0)
MCH: 29.4 pg (ref 26.0–34.0)
Platelets: 197 10*3/uL (ref 150–400)
RBC: 3.16 MIL/uL — ABNORMAL LOW (ref 4.22–5.81)

## 2013-07-19 MED ORDER — OXYCODONE HCL 5 MG PO TABS: 5.0000 mg | ORAL_TABLET | ORAL | Status: DC | PRN

## 2013-07-19 MED ORDER — TRAMADOL HCL 50 MG PO TABS: 50.0000 mg | ORAL_TABLET | Freq: Four times a day (QID) | ORAL | Status: DC | PRN

## 2013-07-19 MED ORDER — ACETAMINOPHEN 325 MG PO TABS: 650.0000 mg | ORAL_TABLET | ORAL | Status: DC | PRN

## 2013-07-19 MED ORDER — SODIUM CHLORIDE 0.9 % IJ SOLN
3.0000 mL | Freq: Two times a day (BID) | INTRAMUSCULAR | Status: DC
  Administered 2013-07-19: 3 mL via INTRAVENOUS

## 2013-07-19 MED ORDER — BACITRACIN ZINC 500 UNIT/GM EX OINT
TOPICAL_OINTMENT | Freq: Two times a day (BID) | CUTANEOUS | Status: DC
  Administered 2013-07-19: 10:00:00 via TOPICAL
  Filled 2013-07-19: qty 28.35

## 2013-07-19 MED ORDER — SODIUM CHLORIDE 0.9 % IJ SOLN: 3.0000 mL | INTRAMUSCULAR | Status: DC | PRN

## 2013-07-19 MED ORDER — ONDANSETRON HCL 4 MG/2ML IJ SOLN: 4.0000 mg | Freq: Four times a day (QID) | INTRAMUSCULAR | Status: DC | PRN

## 2013-07-19 MED ORDER — OXYCODONE HCL 5 MG PO TABS
10.0000 mg | ORAL_TABLET | ORAL | Status: DC | PRN
  Filled 2013-07-19: qty 2

## 2013-07-19 MED ORDER — ONDANSETRON HCL 4 MG PO TABS: 4.0000 mg | ORAL_TABLET | Freq: Four times a day (QID) | ORAL | Status: DC | PRN

## 2013-07-19 MED ORDER — SODIUM CHLORIDE 0.9 % IV SOLN: 250.0000 mL | INTRAVENOUS | Status: DC | PRN

## 2013-07-19 NOTE — H&P (Signed)
[name] is an 79 y.o. male.   Chief Complaint: Forehead laceration HPI: Patient was walking to his office when he tripped over a concrete and fell. He struck the right side of his forehead. He suffered a laceration with a lot of bleeding. There was no loss of consciousness. He was evaluated in the emergency department. CTs of the head, face, and cervical spine were negative. His laceration was closed with good hemostasis by the emergency department. I was asked to see him for admission and observation on the trauma service. He complains of localized pain.  Past Medical History  Diagnosis Date  . History of skin cancer   . Sialolithiasis   . Osteoarthrosis, unspecified whether generalized or localized, shoulder region   . Bipolar disorder, unspecified   . Unspecified essential hypertension   . Macular degeneration, age related, exudative     revieving intra-ocular injections left eye    Past Surgical History  Procedure Laterality Date  . Hemorrhoid surgery  1968    Family History  Problem Relation Age of Onset  . Lymphoma Mother   . Colon cancer Sister   . COPD Sister   . Diabetes Neg Hx   . Hyperlipidemia Neg Hx   . Heart disease Neg Hx   . Cancer Sister     colon cancer   Social History:  reports that he has never smoked. He does not have any smokeless tobacco history on file. He reports that he drinks about 3.5 ounces of alcohol per week. His drug history is not on file.  Allergies: No Known Allergies   (Not in a hospital admission)  Results for orders placed during the hospital encounter of 07/18/13 (from the past 48 hour(s))  TYPE AND SCREEN     Status: None   Collection Time    07/18/13  8:16 PM      Result Value Range   ABO/RH(D) A NEG     Antibody Screen NEG     Sample Expiration 07/21/2013    ABO/RH     Status: None   Collection Time    07/18/13  8:16 PM      Result Value Range   ABO/RH(D) A NEG    COMPREHENSIVE METABOLIC PANEL     Status: Abnormal   Collection Time    07/18/13  8:17 PM      Result Value Range   Sodium 130 (*) 135 - 145 mEq/L   Potassium 4.0  3.5 - 5.1 mEq/L   Chloride 98  96 - 112 mEq/L   CO2 22  19 - 32 mEq/L   Glucose, Bld 145 (*) 70 - 99 mg/dL   BUN 16  6 - 23 mg/dL   Creatinine, Ser 1.61  0.50 - 1.35 mg/dL   Calcium 8.1 (*) 8.4 - 10.5 mg/dL   Total Protein 6.6  6.0 - 8.3 g/dL   Albumin 3.5  3.5 - 5.2 g/dL   AST 18  0 - 37 U/L   ALT 11  0 - 53 U/L   Alkaline Phosphatase 47  39 - 117 U/L   Total Bilirubin 0.4  0.3 - 1.2 mg/dL   GFR calc non Af Amer >90  >90 mL/min   GFR calc Af Amer >90  >90 mL/min   Comment: (NOTE)     The eGFR has been calculated using the CKD EPI equation.     This calculation has not been validated in all clinical situations.     eGFR's persistently <90  mL/min signify possible Chronic Kidney     Disease.  CBC     Status: Abnormal   Collection Time    07/18/13  8:17 PM      Result Value Range   WBC 12.7 (*) 4.0 - 10.5 K/uL   RBC 3.80 (*) 4.22 - 5.81 MIL/uL   Hemoglobin 11.1 (*) 13.0 - 17.0 g/dL   HCT 96.0 (*) 45.4 - 09.8 %   MCV 82.9  78.0 - 100.0 fL   MCH 29.2  26.0 - 34.0 pg   MCHC 35.2  30.0 - 36.0 g/dL   RDW 11.9  14.7 - 82.9 %   Platelets 213  150 - 400 K/uL  PROTIME-INR     Status: None   Collection Time    07/18/13  8:17 PM      Result Value Range   Prothrombin Time 14.2  11.6 - 15.2 seconds   INR 1.12  0.00 - 1.49  POCT I-STAT, CHEM 8     Status: Abnormal   Collection Time    07/18/13  8:39 PM      Result Value Range   Sodium 131 (*) 135 - 145 mEq/L   Potassium 3.9  3.5 - 5.1 mEq/L   Chloride 98  96 - 112 mEq/L   BUN 17  6 - 23 mg/dL   Creatinine, Ser 5.62  0.50 - 1.35 mg/dL   Glucose, Bld 130 (*) 70 - 99 mg/dL   Calcium, Ion 8.65 (*) 1.13 - 1.30 mmol/L   TCO2 24  0 - 100 mmol/L   Hemoglobin 11.9 (*) 13.0 - 17.0 g/dL   HCT 78.4 (*) 69.6 - 29.5 %   Ct Head Wo Contrast  07/18/2013   CLINICAL DATA:  Larey Seat. Found down.  EXAM: CT HEAD WITHOUT CONTRAST  TECHNIQUE:  Contiguous axial images were obtained from the base of the skull through the vertex without intravenous contrast.  COMPARISON:  02/23/2006.  FINDINGS: There is a large right frontal scalp hematoma and laceration but no underlying skull fracture. No foreign body.  Stable 2 slightly progressive periventricular white matter disease. Stable cerebral atrophy and mild associated ventriculomegaly. No extra-axial fluid collections are identified. The no CT findings for hemispheric infarction an or intracranial hemorrhage. No mass lesions. The brainstem and cerebellum are grossly normal and stable.  The paranasal sinuses demonstrate chronic right-sided maxillary and ethmoid sinus disease. . The globes are intact.  IMPRESSION: Large right frontal scalp hematoma and laceration without underlying skull fracture.  No acute intracranial findings.   Electronically Signed   By: Loralie Champagne M.D.   On: 07/18/2013 19:20   Ct Cervical Spine Wo Contrast  07/18/2013   CLINICAL DATA:  Found down.  EXAM: CT CERVICAL SPINE WITHOUT CONTRAST  TECHNIQUE: Multidetector CT imaging of the cervical spine was performed without intravenous contrast. Multiplanar CT image reconstructions were also generated.  COMPARISON:  None.  FINDINGS: Moderate degenerative cervical spondylosis with multilevel disc disease and facet disease. No acute fracture is identified. The skullbase C1 and C1-2 articulations are maintained. Moderate C1-2 degenerative changes. The facets are normally aligned. No facet or laminar fractures. No abnormal prevertebral soft tissue swelling. There is multilevel bony foraminal stenosis due to facet disease and uncinate spurring.  Extensive carotid artery calcifications are noted.  IMPRESSION: Degenerative cervical spondylosis but no acute fracture.   Electronically Signed   By: Loralie Champagne M.D.   On: 07/18/2013 19:15   Ct Maxillofacial Wo Cm  07/18/2013   CLINICAL DATA:  Fell. Facial trauma.  EXAM: CT MAXILLOFACIAL  WITHOUT CONTRAST  TECHNIQUE: Multidetector CT imaging of the maxillofacial structures was performed. Multiplanar CT image reconstructions were also generated. A small metallic BB was placed on the right temple in order to reliably differentiate right from left.  COMPARISON:  None.  FINDINGS: No acute facial bone fractures are identified. There is chronic right maxillary sinus disease and scattered right ethmoid sinus disease. The right frontal sinus is partially opacified. The mastoid air cells and middle ear cavities are clear. The mandibular condyles are normally located. Severe degenerative changes involving both joints. No mandible fracture.  IMPRESSION: No acute facial bone fractures.  Paranasal sinus disease.   Electronically Signed   By: Loralie Champagne M.D.   On: 07/18/2013 19:18    Review of Systems  Constitutional: Negative.   HENT:       See history of present illness  Eyes:       Legally blind from macular degeneration  Respiratory: Negative.   Cardiovascular: Negative.   Gastrointestinal: Negative.   Genitourinary: Negative.   Musculoskeletal: Negative.   Skin: Negative.   Neurological: Negative for loss of consciousness.  Endo/Heme/Allergies: Negative.   Psychiatric/Behavioral: Negative.     Blood pressure 165/112, pulse 79, temperature 98.3 F (36.8 C), temperature source Oral, resp. rate 14, weight 86.183 kg (190 lb), SpO2 97.00%. Physical Exam  Constitutional: He is oriented to person, place, and time. He appears well-developed and well-nourished. No distress.  HENT:  Head:    5 cm hematoma right forehead with central laceration intact with sutures. Impacted cerumen and bilateral external auditory canal.  Eyes: EOM are normal. Pupils are equal, round, and reactive to [place].  Neck: Normal range of motion. Neck supple. No tracheal deviation present.  Cardiovascular: Normal rate, normal heart sounds and intact distal pulses.   Respiratory: Effort normal and breath sounds  normal. No stridor. No respiratory distress. He has no wheezes. He has no rales.  GI: Soft. Bowel sounds are normal. He exhibits no distension. There is no tenderness. There is no rebound and no guarding.  Musculoskeletal: Normal range of motion. He exhibits no edema.  Neurological: He is alert and oriented to person, place, and time. He has normal strength. He displays no tremor. He exhibits normal muscle tone. He displays no seizure activity. GCS eye subscore is 4. GCS verbal subscore is 5. GCS motor subscore is 6.  Significantly hard of hearing  Skin: Skin is warm.  Psychiatric: He has a normal mood and affect.     Assessment/Plan Status post fall with right forehead hematoma and laceration with acute blood loss anemia. Will bed for observation. Physical therapy evaluation in a.m. Plan was discussed in detail with the patient and his wife.  Jaquasia Doscher E 07/19/2013, 12:30 AM

## 2013-07-19 NOTE — Progress Notes (Signed)
Pt for discharge home today with out pt PT/ IV D/C with dressing CDI to R temple.  Dressing supplies provided for home use, D/C instructions and Rx given with verbalized understanding.  Family at bedside to assist pt with discharge. Staff brought pt downstairs via wheelchair.

## 2013-07-19 NOTE — Progress Notes (Signed)
Patient ID: [name], male   DOB: 03/04/1928, 79 y.o.   MRN: [id number]  LOS: 1 day   Subjective: Pt alert and awake.  Lac with oozing.  Minimal pain.  Has chronic pain to right knee, denies weakness.  Has not been oob.  Voiding without difficulties.  Denies weakness.  He has advanced macular degeneration   Objective: Vital signs in last 24 hours: Temp:  [97.9 F (36.6 C)-98.3 F (36.8 C)] 98.3 F (36.8 C) (10/03 0604) Pulse Rate:  [67-86] 67 (10/03 0604) Resp:  [14-27] 18 (10/03 0604) BP: (128-218)/(44-112) 139/44 mmHg (10/03 0604) SpO2:  [96 %-98 %] 97 % (10/03 0604) Weight:  [139 lb 6.4 oz (63.231 kg)-190 lb (86.183 kg)] 139 lb 6.4 oz (63.231 kg) (10/03 0141) Last BM Date: 07/18/13  Lab Results:  CBC  Recent Labs  07/18/13 2017 07/18/13 2039 07/19/13 0618  WBC 12.7*  --  9.6  HGB 11.1* 11.9* 9.3*  HCT 31.5* 35.0* 26.4*  PLT 213  --  197   BMET  Recent Labs  07/18/13 2017 07/18/13 2039  NA 130* 131*  K 4.0 3.9  CL 98 98  CO2 22  --   GLUCOSE 145* 147*  BUN 16 17  CREATININE 0.61 0.80  CALCIUM 8.1*  --     Imaging: Ct Head Wo Contrast  07/18/2013   CLINICAL DATA:  Larey Seat. Found down.  EXAM: CT HEAD WITHOUT CONTRAST  TECHNIQUE: Contiguous axial images were obtained from the base of the skull through the vertex without intravenous contrast.  COMPARISON:  02/23/2006.  FINDINGS: There is a large right frontal scalp hematoma and laceration but no underlying skull fracture. No foreign body.  Stable 2 slightly progressive periventricular white matter disease. Stable cerebral atrophy and mild associated ventriculomegaly. No extra-axial fluid collections are identified. The no CT findings for hemispheric infarction an or intracranial hemorrhage. No mass lesions. The brainstem and cerebellum are grossly normal and stable.  The paranasal sinuses demonstrate chronic right-sided maxillary and ethmoid sinus disease. . The globes are intact.  IMPRESSION: Large right frontal  scalp hematoma and laceration without underlying skull fracture.  No acute intracranial findings.   Electronically Signed   By: Loralie Champagne M.D.   On: 07/18/2013 19:20   Ct Cervical Spine Wo Contrast  07/18/2013   CLINICAL DATA:  Found down.  EXAM: CT CERVICAL SPINE WITHOUT CONTRAST  TECHNIQUE: Multidetector CT imaging of the cervical spine was performed without intravenous contrast. Multiplanar CT image reconstructions were also generated.  COMPARISON:  None.  FINDINGS: Moderate degenerative cervical spondylosis with multilevel disc disease and facet disease. No acute fracture is identified. The skullbase C1 and C1-2 articulations are maintained. Moderate C1-2 degenerative changes. The facets are normally aligned. No facet or laminar fractures. No abnormal prevertebral soft tissue swelling. There is multilevel bony foraminal stenosis due to facet disease and uncinate spurring.  Extensive carotid artery calcifications are noted.  IMPRESSION: Degenerative cervical spondylosis but no acute fracture.   Electronically Signed   By: Loralie Champagne M.D.   On: 07/18/2013 19:15   Dg Chest Portable 1 View  07/19/2013   CLINICAL DATA:  Larey Seat.  EXAM: PORTABLE CHEST - 1 VIEW  COMPARISON:  04/18/2009  FINDINGS: The cardiac silhouette, mediastinal and hilar contours are within normal limits and stable. There is mild tortuosity and calcification of the thoracic aorta. Mild stable emphysematous changes. The lungs are clear. No pleural effusion. The bony thorax is intact.  IMPRESSION: No acute cardiopulmonary findings.  Electronically Signed   By: Loralie Champagne M.D.   On: 07/19/2013 00:30   Ct Maxillofacial Wo Cm  07/18/2013   CLINICAL DATA:  Larey Seat. Facial trauma.  EXAM: CT MAXILLOFACIAL WITHOUT CONTRAST  TECHNIQUE: Multidetector CT imaging of the maxillofacial structures was performed. Multiplanar CT image reconstructions were also generated. A small metallic BB was placed on the right temple in order to reliably  differentiate right from left.  COMPARISON:  None.  FINDINGS: No acute facial bone fractures are identified. There is chronic right maxillary sinus disease and scattered right ethmoid sinus disease. The right frontal sinus is partially opacified. The mastoid air cells and middle ear cavities are clear. The mandibular condyles are normally located. Severe degenerative changes involving both joints. No mandible fracture.  IMPRESSION: No acute facial bone fractures.  Paranasal sinus disease.   Electronically Signed   By: Loralie Champagne M.D.   On: 07/18/2013 19:18   Physical Exam  Constitutional: He is oriented to person, place, and time. He appears well-developed and well-nourished. No distress.  Head: right forehead laceration, small amount oozing, hemostasis achieved with manual pressure.  I cleansed the side of head to ensure no other lacs and redressed the wound after applying bacitracin.  Right periorbital hematoma. Eyes: EOM are normal. Pupils are equal, round, and reactive to Ken.  Cardiovascular: Normal rate, normal heart sounds and intact distal pulses.  Respiratory:CTA He has no wheezes. He has no rales.  GI: Soft. Bowel sounds are normal. He exhibits no distension. There is no tenderness. There is no rebound and no guarding.  Neurological: He is alert and oriented to person, place, and time.  He exhibits no weakness.  Patient Active Problem List   Diagnosis Date Noted  . Traumatic hematoma of forehead 07/19/2013  . 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 OSTEOARTHRITIS, SHOULDER, RIGHT 08/27/2009  . DISORDER, BIPOLAR NOS 07/16/2007  . HYPERTENSION 07/16/2007   Assessment/Plan Hx macular degeneration, legally blind  GLF Right forehead hematoma/laceration -sutures, remove next Wednesday in clinic -local care, dressing changed today and demonstrated to wife. ABL anemia-stable, apparently large amount of blood loss from the laceration at the scene VTE-SCDs, mobilize FEN - tolerating  diet Dispo -- PT eval today, if okay discharge.  Ashok Norris, ANP-BC Pager: [phone] General Trauma [place] Pager: [phone]   07/19/2013  9:11 AM

## 2013-07-19 NOTE — Discharge Summary (Signed)
Physician Discharge Summary  [name]:[id number] DOB: 1928/01/04 DOA: 07/18/2013  PCP: Nicholas Regulus, MD  Consultation: none  Admit date: 07/18/2013 Discharge date: 07/19/2013  Recommendations for Outpatient Follow-up:  1. PT, arranged by case management   Follow-up Information   Follow up with [facility] On 07/24/2013. (arrive no later than 2pm for a 2:15pm appointment to have your sutures removed.)    Contact information:   [address] [place] [address] [phone]       Follow up with Nicholas Regulus, MD. (2-3 weeks)    Specialty:  Internal Medicine   Contact information:   [address] [place] [address] [phone]      Discharge Diagnoses:  1. Ground level fall 2. Right forehead laceration 3. Right forehead hematoma 4. ABL anemia 5. Hx of macular degeneration, legally blind 6. Elevated blood pressure  Surgical Procedure: laceration sutured by EDP  Discharge Condition: stable Disposition: home  Diet recommendation: regular  Filed Weights   07/18/13 1823 07/19/13 0141  Weight: 190 lb (86.183 kg) 139 lb 6.4 oz (63.231 kg)     Filed Vitals:   07/19/13 1024  BP: 151/73  Pulse: 67  Temp: 99.7 F (37.6 C)  Resp: 18     Hospital Course:  [name] is a 79 year old male with a history of macular degeneration, legally blind who tripped and fell on concrete.  He denied LOC, brought to the ED via EMS.  He was found to have a laceration to forehead.  CT of head, spine were negative.  Laceration was closed by EDP with good hemostasis.  Apparently he lost a moderate amount of blood at the scene.  Trauma was asked to see and he was subsequently admitted for observation.  His blood pressure noted at 170s given 1 dose of labetalol and improved.  He reports previously being on antihypertensive therapy, however, now takes vitamins and herbs.  His blood pressure has been stable today, he will need follow up with PCP to recheck  and treat if needed.  He had minimal pain, received 1 dose of pain meds.  He was started on a diet which he tolerated.  Physical therapy for mobilization who recommended outpatient PT, this was arranged for the patient and a walker which was provided for him.  The laceration had a bit oozing, which resolved with manual pressure, bacitracin and dressing until next Wednesday.  He will follow up to have sutures removed.  He denies headaches, dizziness, fatigue. VSS.  Hemoglobin and hematocrit dropped to 9.3/26.2, will need to recheck in 2-3weeks.  We discussed warning signs that warrant immediate attention.  He was given a small Rx for tramadol as needed for pain, but encouraged to use tylenol and ice.  He verbalizes understanding of discharge.  He and Mr. Follett did not have further questions.    Discharge Instructions     Medication List         acetaminophen 325 MG tablet  Commonly known as:  TYLENOL  Take 2 tablets (650 mg total) by mouth every 4 (four) hours as needed.     Cholecalciferol 1000 UNITS tablet  Take 1,000 Units by mouth daily.     [employer] ZINC 50 MG Tabs  Take 1 tablet by mouth 2 (two) times daily.     Folic Acid-Vit B6-Vit B12 2.2-25-0.5 MG Tabs  Take 1 tablet by mouth daily.     Garlic 1000 MG Caps  Take by  mouth 2 (two) times daily.     Glucosamine 500 MG Tabs  Take by mouth daily.     multivitamin,tx-minerals tablet  Take 1 tablet by mouth daily.     Selenium 100 MCG Tabs  Take by mouth daily.     traMADol 50 MG tablet  Commonly known as:  ULTRAM  Take 1 tablet (50 mg total) by mouth every 6 (six) hours as needed for pain.     vitamin A 16109 UNIT capsule  Take by mouth daily.     Vitamin C-Acerola 500 MG Chew  Chew 2,000 mg by mouth daily.     vitamin E 800 UNIT capsule  Take 800 Units by mouth daily.           Follow-up Information   Follow up with [facility] On 07/24/2013. (arrive no later than 2pm for a 2:15pm appointment to have your  sutures removed.)    Contact information:   [address] [place] [address] [phone]       Follow up with Nicholas Regulus, MD. (2-3 weeks)    Specialty:  Internal Medicine   Contact information:   [address] [place] [address] [phone]        The results of significant diagnostics from this hospitalization (including imaging, microbiology, ancillary and laboratory) are listed below for reference.    Significant Diagnostic Studies: Ct Head Wo Contrast  07/18/2013   CLINICAL DATA:  Larey Seat. Found down.  EXAM: CT HEAD WITHOUT CONTRAST  TECHNIQUE: Contiguous axial images were obtained from the base of the skull through the vertex without intravenous contrast.  COMPARISON:  02/23/2006.  FINDINGS: There is a large right frontal scalp hematoma and laceration but no underlying skull fracture. No foreign body.  Stable 2 slightly progressive periventricular white matter disease. Stable cerebral atrophy and mild associated ventriculomegaly. No extra-axial fluid collections are identified. The no CT findings for hemispheric infarction an or intracranial hemorrhage. No mass lesions. The brainstem and cerebellum are grossly normal and stable.  The paranasal sinuses demonstrate chronic right-sided maxillary and ethmoid sinus disease. . The globes are intact.  IMPRESSION: Large right frontal scalp hematoma and laceration without underlying skull fracture.  No acute intracranial findings.   Electronically Signed   By: Loralie Champagne M.D.   On: 07/18/2013 19:20   Ct Cervical Spine Wo Contrast  07/18/2013   CLINICAL DATA:  Found down.  EXAM: CT CERVICAL SPINE WITHOUT CONTRAST  TECHNIQUE: Multidetector CT imaging of the cervical spine was performed without intravenous contrast. Multiplanar CT image reconstructions were also generated.  COMPARISON:  None.  FINDINGS: Moderate degenerative cervical spondylosis with multilevel disc disease and facet disease. No acute fracture is  identified. The skullbase C1 and C1-2 articulations are maintained. Moderate C1-2 degenerative changes. The facets are normally aligned. No facet or laminar fractures. No abnormal prevertebral soft tissue swelling. There is multilevel bony foraminal stenosis due to facet disease and uncinate spurring.  Extensive carotid artery calcifications are noted.  IMPRESSION: Degenerative cervical spondylosis but no acute fracture.   Electronically Signed   By: Loralie Champagne M.D.   On: 07/18/2013 19:15   Dg Chest Portable 1 View  07/19/2013   CLINICAL DATA:  Larey Seat.  EXAM: PORTABLE CHEST - 1 VIEW  COMPARISON:  04/18/2009  FINDINGS: The cardiac silhouette, mediastinal and hilar contours are within normal limits and stable. There is mild tortuosity and calcification of the thoracic aorta. Mild stable emphysematous changes. The lungs are  clear. No pleural effusion. The bony thorax is intact.  IMPRESSION: No acute cardiopulmonary findings.   Electronically Signed   By: Loralie Champagne M.D.   On: 07/19/2013 00:30   Ct Maxillofacial Wo Cm  07/18/2013   CLINICAL DATA:  Larey Seat. Facial trauma.  EXAM: CT MAXILLOFACIAL WITHOUT CONTRAST  TECHNIQUE: Multidetector CT imaging of the maxillofacial structures was performed. Multiplanar CT image reconstructions were also generated. A small metallic BB was placed on the right temple in order to reliably differentiate right from left.  COMPARISON:  None.  FINDINGS: No acute facial bone fractures are identified. There is chronic right maxillary sinus disease and scattered right ethmoid sinus disease. The right frontal sinus is partially opacified. The mastoid air cells and middle ear cavities are clear. The mandibular condyles are normally located. Severe degenerative changes involving both joints. No mandible fracture.  IMPRESSION: No acute facial bone fractures.  Paranasal sinus disease.   Electronically Signed   By: Loralie Champagne M.D.   On: 07/18/2013 19:18    Microbiology: No results  found for this or any previous visit (from the past 240 hour(s)).   Labs: Basic Metabolic Panel:  Recent Labs Lab 07/18/13 2017 07/18/13 2039  NA 130* 131*  K 4.0 3.9  CL 98 98  CO2 22  --   GLUCOSE 145* 147*  BUN 16 17  CREATININE 0.61 0.80  CALCIUM 8.1*  --    Liver Function Tests:  Recent Labs Lab 07/18/13 2017  AST 18  ALT 11  ALKPHOS 47  BILITOT 0.4  PROT 6.6  ALBUMIN 3.5   CBC:  Recent Labs Lab 07/18/13 2017 07/18/13 2039 07/19/13 0618  WBC 12.7*  --  9.6  HGB 11.1* 11.9* 9.3*  HCT 31.5* 35.0* 26.4*  MCV 82.9  --  83.5  PLT 213  --  197    Active Problems:   Traumatic hematoma of forehead   Time coordinating discharge: 30 mins   Signed:  Tydarius Yawn, ANP-BC

## 2013-07-19 NOTE — Progress Notes (Signed)
Should be able to go home later today.  This patient has been seen and I agree with the findings and treatment plan.  Derrik O. Sanjiv Castorena, III, MD, FACS [phone] (pager) [phone] (direct pager) Trauma Surgeon 

## 2013-07-19 NOTE — Progress Notes (Signed)
LATE ENTRY Met with patient and wife at bedside 1245pm.  Pt recommended for outpatient PT at discharge and for a rolling walker.  [facility] will provide walker to pt.  Outpt therapy prescription provided to pt's wife along with a list of the [facility] outpatient rehab locations.  Explained to wife that she may use any outpt rehab facility, [facility] or not, but was advised to stay within the network to remain as cost-effective as possible. Wife stated understanding.

## 2013-07-19 NOTE — Evaluation (Signed)
Physical Therapy Evaluation Patient Details Name: [name] MRN: [id number] DOB: [birth year] Today's Date: 07/19/2013 Time: 1125-1150 PT Time Calculation (min): 25 min  PT Assessment / Plan / Recommendation History of Present Illness  Pt adm after fall going to his office.  Pt with forehead hematoma/laceration.  Clinical Impression  Pt presents to PT with high fall risk due to poor vision and decr safety awareness/impulsivity (these are baseline).  Pt can benefit from continued PT to incr safety.  Feel pt can return home with wife and go to OPPT to work on balance and safety issues.  Currently pt is still driving his car to the office despite his poor vision. Also spoke with pt about seeing someone for his chronic lt knee pain.    PT Assessment  Patient needs continued PT services    Follow Up Recommendations  Outpatient PT    Does the patient have the potential to tolerate intense rehabilitation      Barriers to Discharge        Equipment Recommendations  Rolling walker with 5" wheels    Recommendations for Other Services     Frequency Min 3X/week    Precautions / Restrictions Precautions Precautions: Fall   Pertinent Vitals/Pain No c/o's      Mobility  Bed Mobility Bed Mobility: Supine to Sit;Sit to Supine Supine to Sit: 7: Independent Sit to Supine: 7: Independent Transfers Transfers: Sit to Stand;Stand to Sit Sit to Stand: 6: Modified independent (Device/Increase time);With upper extremity assist;From bed Stand to Sit: 6: Modified independent (Device/Increase time);With upper extremity assist;To bed Ambulation/Gait Ambulation/Gait Assistance: 5: Supervision Ambulation Distance (Feet): 600 Feet Assistive device: Rolling walker;None Ambulation/Gait Assistance Details: Slight unsteadiness but no loss of balance.  Rolling walker gives some incr stability. Gait Pattern: Step-through pattern;Antalgic Gait velocity: fast General Gait Details: Pt's poor vision is  biggest risk factor for falls with mobility. Stairs: Yes Stairs Assistance: 6: Modified independent (Device/Increase time) Stair Management Technique: One rail Right;Step to pattern;Forwards;Alternating pattern Number of Stairs: 5    Exercises     PT Diagnosis: Difficulty walking  PT Problem List: Decreased balance;Decreased mobility;Decreased safety awareness PT Treatment Interventions: DME instruction;Gait training;Functional mobility training;Therapeutic activities;Balance training;Patient/family education     PT Goals(Current goals can be found in the care plan section) Acute Rehab PT Goals Patient Stated Goal: Return home PT Goal Formulation: With patient Time For Goal Achievement: 07/22/13 Potential to Achieve Goals: Good  Visit Information  Last PT Received On: 07/19/13 Assistance Needed: +1 History of Present Illness: Pt adm after fall going to his office.  Pt with forehead hematoma/laceration.       Prior Functioning  Home Living Family/patient expects to be discharged to:: Private residence Living Arrangements: Spouse/significant other Available Help at Discharge: Family;Available 24 hours/day Type of Home: House Home Layout: Two level;Able to live on main level with bedroom/bathroom Home Equipment: None Prior Function Level of Independence: Independent Comments: Pt still drives to work despite being legally blind. Communication Communication: No difficulties    Cognition  Cognition Arousal/Alertness: Awake/alert Behavior During Therapy: WFL for tasks assessed/performed Overall Cognitive Status: History of cognitive impairments - at baseline Area of Impairment: Safety/judgement Safety/Judgement: Decreased awareness of safety General Comments: Pt impulsive with mobility (wife reports this is baseline). Pt is still driving despite being legally blind.    Extremity/Trunk Assessment Upper Extremity Assessment Upper Extremity Assessment: Overall WFL for tasks  assessed Lower Extremity Assessment Lower Extremity Assessment: Overall WFL for tasks assessed   Balance Balance Balance  Assessed: Yes Static Standing Balance Static Standing - Balance Support: No upper extremity supported;During functional activity Static Standing - Level of Assistance: 6: Modified independent (Device/Increase time)  End of Session PT - End of Session Activity Tolerance: Patient tolerated treatment well Patient left: in bed;with call bell/phone within reach;with bed alarm set;with family/visitor present Nurse Communication: Mobility status  GP Functional Assessment Tool Used: clinical judgement Functional Limitation: Mobility: Walking and moving around Mobility: Walking and Moving Around Current Status (Z6109): At least 1 percent but less than 20 percent impaired, limited or restricted Mobility: Walking and Moving Around Goal Status [id number]): 0 percent impaired, limited or restricted   [facility] 07/19/2013, 12:08 PM  [facility] PT [phone]

## 2013-07-19 NOTE — Progress Notes (Signed)
UR completed.  Leeum Sankey, RN BSN [facility] CCM Trauma/Neuro ICU Case Manager [phone]  

## 2013-07-20 NOTE — ED Provider Notes (Addendum)
I saw and evaluated the patient, reviewed the resident's note and I agree with the findings and plan.  This is an 79 yo male who is legally blind presents following a mechanical fall.  Arterial bleeding and large hematoma noted to the right forehead.  INitially controlled with compressive dressing.  No other injury noted on PE.  VS stable.  CT head and labs reassuring.  Patient lost a significant amount of blood during attempted repair which was done in my presence.  Laceration was roughly approximately with a figure of 8 stitch to achieve bleeding control.  Patient admitted to trauma for monitoring of H/H given blood loss.   Diagnoses as follows (correction to resident's note) 1. Traumatic hematoma of forehead, initial encounter  2. Laceration of forehead, initial encounter  3.  arterial bleeding  4. Acute blood loss anemia    Shon Baton, MD 07/20/13 1856  I agree with resident's interpretation of EKG.  Shon Baton, MD 07/31/13 [phone]

## 2013-07-24 ENCOUNTER — Ambulatory Visit (INDEPENDENT_AMBULATORY_CARE_PROVIDER_SITE_OTHER): Payer: Medicare Other | Admitting: Orthopedic Surgery

## 2013-07-24 ENCOUNTER — Encounter (INDEPENDENT_AMBULATORY_CARE_PROVIDER_SITE_OTHER): Payer: Self-pay

## 2013-07-24 VITALS — BP 138/82 | HR 86 | Temp 98.0°F | Resp 18 | Ht 66.0 in | Wt 145.6 lb

## 2013-07-24 DIAGNOSIS — S0003XA Contusion of scalp, initial encounter: Secondary | ICD-10-CM

## 2013-07-24 DIAGNOSIS — S0083XD Contusion of other part of head, subsequent encounter: Secondary | ICD-10-CM

## 2013-07-24 NOTE — Patient Instructions (Signed)
Wash wounds daily in shower with soap and water. Apply antibiotic ointment (e.g. Neosporin) twice daily and as needed to keep moist. Cover with dry dressing.

## 2013-07-24 NOTE — Progress Notes (Signed)
Subjective Other comes in 6 days status post a fall where he suffered a forehead laceration and contusion.He's been doing well since discharge.   Objective Head:Laceration is well approximated and cleaned. There is a large (golf ball sized) hematoma underlying the laceration.   Assessment & Plan Fall Forehead laceration -- Given the tension the wound is under because of the hematoma I'm going to leave the sutures in place for one more week at which point we will remove them. I cautioned to the patient and his wife that the wound might drain liquefied hematoma and not to let that worried him. They are to continue wound care as they have been doing. They will call sooner if there is a problem.    Nicholas Caldron, PA-C Pager: [phone] General Trauma PA Pager: [phone]

## 2013-07-31 ENCOUNTER — Encounter: Payer: Self-pay | Admitting: Internal Medicine

## 2013-07-31 ENCOUNTER — Ambulatory Visit (INDEPENDENT_AMBULATORY_CARE_PROVIDER_SITE_OTHER): Payer: Medicare Other | Admitting: General Surgery

## 2013-07-31 ENCOUNTER — Encounter (INDEPENDENT_AMBULATORY_CARE_PROVIDER_SITE_OTHER): Payer: Medicare Other

## 2013-07-31 ENCOUNTER — Ambulatory Visit (INDEPENDENT_AMBULATORY_CARE_PROVIDER_SITE_OTHER): Payer: Medicare Other | Admitting: Internal Medicine

## 2013-07-31 ENCOUNTER — Encounter (INDEPENDENT_AMBULATORY_CARE_PROVIDER_SITE_OTHER): Payer: Self-pay

## 2013-07-31 VITALS — BP 210/100 | HR 80 | Temp 97.5°F | Wt 144.6 lb

## 2013-07-31 VITALS — BP 200/110 | HR 80 | Temp 99.1°F | Resp 16 | Ht 67.0 in | Wt 146.8 lb

## 2013-07-31 DIAGNOSIS — R5381 Other malaise: Secondary | ICD-10-CM

## 2013-07-31 DIAGNOSIS — I10 Essential (primary) hypertension: Secondary | ICD-10-CM

## 2013-07-31 DIAGNOSIS — S0083XD Contusion of other part of head, subsequent encounter: Secondary | ICD-10-CM

## 2013-07-31 DIAGNOSIS — F319 Bipolar disorder, unspecified: Secondary | ICD-10-CM

## 2013-07-31 MED ORDER — AMLODIPINE BESYLATE 10 MG PO TABS: 10.0000 mg | ORAL_TABLET | Freq: Every day | ORAL | Status: DC

## 2013-07-31 MED ORDER — FUROSEMIDE 40 MG PO TABS: 40.0000 mg | ORAL_TABLET | Freq: Every day | ORAL | Status: DC

## 2013-07-31 NOTE — Assessment & Plan Note (Signed)
Marked hypertension. Reviewed chart to 2008 - pattern of elevated blood pressure.  Plan He is to resume medication. Rx sent to pharmacy  He is instructed to establish with another MD as soon as possible.

## 2013-07-31 NOTE — Progress Notes (Signed)
Subjective:    Patient ID: [name], male    DOB: [birth year], 79 y.o.   MRN: [id number]  HPI [name] presents for hospital follow up with the last visit being April 2012. He has not been taking blood pressure medication as prescribed. During the interview he became very hostile: gesticulating and being very unreasonable. I had to ask him to leave. He is instructed, in the presence of his wife, to find and see a doctor as soon as possible and that he should resume his medications. Rx is sent to patients pharmacy for previous blood pressure medications.  Past Medical History  Diagnosis Date  . History of skin cancer   . Sialolithiasis   . Osteoarthrosis, unspecified whether generalized or localized, shoulder region   . Bipolar disorder, unspecified   . Unspecified essential hypertension   . Macular degeneration, age related, exudative     revieving intra-ocular injections left eye   Past Surgical History  Procedure Laterality Date  . Hemorrhoid surgery  1968   Family History  Problem Relation Age of Onset  . Lymphoma Mother   . Colon cancer Sister   . COPD Sister   . Diabetes Neg Hx   . Hyperlipidemia Neg Hx   . Heart disease Neg Hx   . Cancer Sister     colon cancer   History   Social History  . Marital Status: Married    Spouse Name: N/A    Number of Children: 1  . Years of Education: 16   Occupational History  . Programmer, systems   Social History Main Topics  . Smoking status: Never Smoker   . Smokeless tobacco: Not on file  . Alcohol Use: 3.5 oz/week    7 drink(s) per week  . Drug Use: Not on file  . Sexual Activity: No   Other Topics Concern  . Not on file   Social History Narrative   [employer]. College grad-[employer]. Married '52. 1 son - '65.  Retired.   End of Life issues - provided packet today (02/10/11)             Current Outpatient Prescriptions on File Prior to Visit  Medication Sig Dispense Refill  .  acetaminophen (TYLENOL) 325 MG tablet Take 2 tablets (650 mg total) by mouth every 4 (four) hours as needed.      . Ascorbic Acid (VITAMIN C-ACEROLA) 500 MG CHEW Chew 2,000 mg by mouth daily.        . Cholecalciferol 1000 UNITS tablet Take 1,000 Units by mouth daily.        . [employer] Zinc 50 MG TABS Take 1 tablet by mouth 2 (two) times daily.        . Folic Acid-Vit B6-Vit B12 2.2-25-0.5 MG TABS Take 1 tablet by mouth daily.        . Garlic 1000 MG CAPS Take by mouth 2 (two) times daily.        . Glucosamine 500 MG TABS Take by mouth daily.        . Multiple Vitamins-Minerals (MULTIVITAMIN,TX-MINERALS) tablet Take 1 tablet by mouth daily.        . Selenium 100 MCG TABS Take by mouth daily.        . traMADol (ULTRAM) 50 MG tablet Take 1 tablet (50 mg total) by mouth every 6 (six) hours as needed for pain.  30 tablet  0  . vitamin A 78295 UNIT capsule Take  by mouth daily.        . vitamin E 800 UNIT capsule Take 800 Units by mouth daily.         No current facility-administered medications on file prior to visit.    Review of Systems     Objective:   Physical Exam Filed Vitals:   07/31/13 0927  BP: 210/100  Pulse: 80  Temp: 97.5 F (36.4 C)   gen'l- haggard appearing man in no distress but acutely agitated. Neuro - he is alert, seems oriented.       Assessment & Plan:  Asked to leave due to hostile, threatening and disruptive behavior. Mrs. Brandow was present during this exchange.

## 2013-07-31 NOTE — Patient Instructions (Signed)
You had your appointment with your primary care today and he wanted you to take the following medications for your elevated blood pressure:  Amlodipine (Norvasc) 10mg  by mouth daily Furosemide (Lasix) 40mg  by mouth daily  Follow up with [place] in the office if your symptoms of your head worsen or do not improve.    Apply a dry bandage to the right forehead and clean with soap and water and use antibiotic ointment.

## 2013-07-31 NOTE — Progress Notes (Signed)
Subjective: [name]. is a 79 y.o. male who presents today for follow up from ground level fall, right forehead laceration and hematoma, ABL anemia, elevated BP on 07/18/13.  He was discharged from the hospital on 07/19/13.  He is here today for suture removal.  His headache pain is much improved and denies any N/V, dizziness, trouble concentrating.  Eating normally and having BM's.  No memory problems and no trouble with balance.  Denies CP/SOB.  His wife has been dressing his right forehead wound with a bandage daily and cleaning it with a wound cleaning solution and applying antibiotic ointment.    The patient just came from his PCP office Dr. Debby Bud.  He is very angry with him.  He says "he doesn't like to listen to an old man and he doesn't like it that I ask too many questions."  His BP was 210/100 in his office, but he adamantly refused any blood pressure medications.  Dr. Debby Bud called in Norvasc and lasix to his pharmacy per his note.  This afternoon his BP is still 200/110.  He notes he doesn't want to take the medications because he doesn't think he needs them and he doesn't want the side effects.  He was inconsistent with taking them in the past.  He was given BP meds while admitted to the hospital.  He says that Dr. Debby Bud fired him as a patient due to his non-compliance, and told him he needs to find a new medical provider.   Objective: Vital signs in last 24 hours: Reviewed - extremely elevated BP 200/110   PE: General: pleasant at times and raising his voice when angrily talking about his PCP appointment earlier, WD/WN white male who appears in NAD HEENT: head is normocephalic, large contusion to the right forehead with ecchymosis, laceration well healed, but with some skin separation, eschar forming; Sclera are noninjected.  PERRL.  EOM's intact b/l.  Ears and nose without any masses or lesions.  Mouth is pink and moist Heart: regular, rate, and rhythm.  No obvious murmurs,  gallops, or rubs noted.   Lungs: CTAB.  Respiratory effort nonlabored MS: all 4 extremities are symmetrical with no cyanosis, clubbing, or edema. Skin: warm and dry with no masses, lesions, or rashes Neuro/Psych: A&Ox3 with an appropriate affect.  Balance/coordination good, limping gait with cane.   Assessment/Plan  1.  S/P ground level fall (07/18/13): doing well, may resume regular activity without restrictions, Pt will follow up with [place] PRN and knows to call with questions or concerns.   2.  Uncontrolled Hypertension - discussed at length importance of taking his prescribed blood pressure medications (Norvasc and Lasix).  He admits that he had refused to take them because he doesn't want the side effects of the medication.  Even when he did take the medication he is noncompliant with taking it consistently every day.  He is encouraged to take his BP at home and document it daily and restart his BP meds.  We discussed the side effects of elevate BP's and the long term consequences of it being uncontrolled such as substantial risk for stroke and MI.  We discussed tools such as setting an alarm on his phone to remind him to take his pills.  After this long discussion the patient understands the risk and says he will pick up the rx from the pharmacy and take his medications as prescribed.   3.  Deconditioning:  Gave wife rx for OP PT to work on  building strength and gait training.  F/U on an as needed basis.  He is encouraged to find a PCP ASAP in order to manage his blood pressure.  Of note the patient was much more calm during our conversation; however, had to be reminded to be calm when talking about his appointment this morning.  He and his wife are very appreciative to our care, concern, and medical recommendations.     Aris Georgia, PA-C 07/31/2013

## 2013-08-08 ENCOUNTER — Telehealth: Payer: Self-pay

## 2013-08-08 NOTE — Telephone Encounter (Signed)
Discharge letter has been mailed per Dr Illene Regulus.

## 2013-08-12 ENCOUNTER — Ambulatory Visit: Payer: Medicare Other | Attending: General Surgery | Admitting: Physical Therapy

## 2013-08-12 DIAGNOSIS — Z9181 History of falling: Secondary | ICD-10-CM | POA: Insufficient documentation

## 2013-08-12 DIAGNOSIS — R5381 Other malaise: Secondary | ICD-10-CM | POA: Insufficient documentation

## 2013-08-12 DIAGNOSIS — M25569 Pain in unspecified knee: Secondary | ICD-10-CM | POA: Insufficient documentation

## 2013-08-12 DIAGNOSIS — IMO0001 Reserved for inherently not codable concepts without codable children: Secondary | ICD-10-CM | POA: Insufficient documentation

## 2013-08-14 ENCOUNTER — Ambulatory Visit: Payer: Medicare Other | Admitting: Physical Therapy

## 2013-08-19 ENCOUNTER — Ambulatory Visit: Payer: Medicare Other | Attending: General Surgery | Admitting: Physical Therapy

## 2013-08-19 DIAGNOSIS — R5381 Other malaise: Secondary | ICD-10-CM | POA: Insufficient documentation

## 2013-08-19 DIAGNOSIS — Z9181 History of falling: Secondary | ICD-10-CM | POA: Insufficient documentation

## 2013-08-19 DIAGNOSIS — IMO0001 Reserved for inherently not codable concepts without codable children: Secondary | ICD-10-CM | POA: Insufficient documentation

## 2013-08-19 DIAGNOSIS — M25569 Pain in unspecified knee: Secondary | ICD-10-CM | POA: Insufficient documentation

## 2013-08-21 ENCOUNTER — Ambulatory Visit: Payer: Medicare Other | Admitting: Physical Therapy

## 2013-08-26 ENCOUNTER — Ambulatory Visit: Payer: Medicare Other | Admitting: Physical Therapy

## 2013-08-28 ENCOUNTER — Encounter: Payer: Medicare Other | Admitting: Physical Therapy

## 2013-09-02 ENCOUNTER — Encounter: Payer: Medicare Other | Admitting: Physical Therapy

## 2013-09-04 ENCOUNTER — Encounter: Payer: Medicare Other | Admitting: Physical Therapy

## 2015-08-08 ENCOUNTER — Emergency Department (HOSPITAL_COMMUNITY): Payer: Medicare Other

## 2015-08-08 ENCOUNTER — Encounter (HOSPITAL_COMMUNITY): Payer: Self-pay | Admitting: Radiology

## 2015-08-08 ENCOUNTER — Observation Stay (HOSPITAL_COMMUNITY)
Admission: EM | Admit: 2015-08-08 | Discharge: 2015-08-14 | Disposition: A | Discharge status: Skilled Nursing Facility | Payer: Medicare Other | Attending: General Surgery | Admitting: General Surgery

## 2015-08-08 DIAGNOSIS — S2220XA Unspecified fracture of sternum, initial encounter for closed fracture: Secondary | ICD-10-CM | POA: Diagnosis not present

## 2015-08-08 DIAGNOSIS — F319 Bipolar disorder, unspecified: Secondary | ICD-10-CM | POA: Insufficient documentation

## 2015-08-08 DIAGNOSIS — I10 Essential (primary) hypertension: Secondary | ICD-10-CM | POA: Diagnosis not present

## 2015-08-08 DIAGNOSIS — Y92410 Unspecified street and highway as the place of occurrence of the external cause: Secondary | ICD-10-CM | POA: Diagnosis not present

## 2015-08-08 DIAGNOSIS — D62 Acute posthemorrhagic anemia: Secondary | ICD-10-CM | POA: Diagnosis not present

## 2015-08-08 DIAGNOSIS — Z79899 Other long term (current) drug therapy: Secondary | ICD-10-CM | POA: Diagnosis not present

## 2015-08-08 DIAGNOSIS — S2241XA Multiple fractures of ribs, right side, initial encounter for closed fracture: Secondary | ICD-10-CM | POA: Diagnosis not present

## 2015-08-08 DIAGNOSIS — M19011 Primary osteoarthritis, right shoulder: Secondary | ICD-10-CM | POA: Diagnosis not present

## 2015-08-08 DIAGNOSIS — S2249XA Multiple fractures of ribs, unspecified side, initial encounter for closed fracture: Secondary | ICD-10-CM | POA: Diagnosis present

## 2015-08-08 LAB — CBC WITH DIFFERENTIAL/PLATELET
Basophils Absolute: 0 10*3/uL (ref 0.0–0.1)
Basophils Relative: 0 %
EOS ABS: 0.2 10*3/uL (ref 0.0–0.7)
Eosinophils Relative: 1 %
HEMATOCRIT: 33.9 % — AB (ref 39.0–52.0)
HEMOGLOBIN: 11 g/dL — AB (ref 13.0–17.0)
LYMPHS ABS: 2.1 10*3/uL (ref 0.7–4.0)
Lymphocytes Relative: 9 %
MCH: 25.3 pg — AB (ref 26.0–34.0)
MCHC: 32.4 g/dL (ref 30.0–36.0)
MCV: 78.1 fL (ref 78.0–100.0)
Monocytes Absolute: 0.7 10*3/uL (ref 0.1–1.0)
Monocytes Relative: 3 %
NEUTROS ABS: 19 10*3/uL — AB (ref 1.7–7.7)
NEUTROS PCT: 87 %
Platelets: 241 10*3/uL (ref 150–400)
RBC: 4.34 MIL/uL (ref 4.22–5.81)
RDW: 18.5 % — ABNORMAL HIGH (ref 11.5–15.5)
WBC: 21.9 10*3/uL — AB (ref 4.0–10.5)

## 2015-08-08 LAB — I-STAT CHEM 8, ED
BUN: 29 mg/dL — AB (ref 6–20)
CALCIUM ION: 1.18 mmol/L (ref 1.13–1.30)
CHLORIDE: 103 mmol/L (ref 101–111)
CREATININE: 1 mg/dL (ref 0.61–1.24)
Glucose, Bld: 232 mg/dL — ABNORMAL HIGH (ref 65–99)
HEMATOCRIT: 35 % — AB (ref 39.0–52.0)
Hemoglobin: 11.9 g/dL — ABNORMAL LOW (ref 13.0–17.0)
Potassium: 3.7 mmol/L (ref 3.5–5.1)
SODIUM: 139 mmol/L (ref 135–145)
TCO2: 23 mmol/L (ref 0–100)

## 2015-08-08 MED ORDER — MORPHINE SULFATE (PF) 2 MG/ML IV SOLN: 1.0000 mg | INTRAVENOUS | Status: DC | PRN

## 2015-08-08 MED ORDER — IOHEXOL 300 MG/ML  SOLN
75.0000 mL | Freq: Once | INTRAMUSCULAR | Status: AC | PRN
  Administered 2015-08-08: 75 mL via INTRAVENOUS

## 2015-08-08 MED ORDER — MORPHINE SULFATE (PF) 4 MG/ML IV SOLN
4.0000 mg | Freq: Once | INTRAVENOUS | Status: AC
  Administered 2015-08-08: 4 mg via INTRAVENOUS
  Filled 2015-08-08: qty 1
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
## 2015-08-08 NOTE — ED Notes (Signed)
Pt's O2 sats dropped when pt asleep from 99%on RA to 88 on RA. Placed pt on 2L O2 with sats improving to 97%

## 2015-08-08 NOTE — ED Notes (Signed)
Nurse drawing labs. 
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
## 2015-08-08 NOTE — ED Notes (Signed)
Pt sts his blood pressure is always high, at times with SBP in 200's. He denies taking any blood pressure medications and sts takes a lot of vitamins and minerals

## 2015-08-09 DIAGNOSIS — S2220XA Unspecified fracture of sternum, initial encounter for closed fracture: Secondary | ICD-10-CM | POA: Diagnosis not present

## 2015-08-09 DIAGNOSIS — S2249XA Multiple fractures of ribs, unspecified side, initial encounter for closed fracture: Secondary | ICD-10-CM | POA: Diagnosis present

## 2015-08-09 LAB — BASIC METABOLIC PANEL
Anion gap: 6 (ref 5–15)
BUN: 24 mg/dL — ABNORMAL HIGH (ref 6–20)
CHLORIDE: 107 mmol/L (ref 101–111)
CO2: 23 mmol/L (ref 22–32)
Calcium: 8.6 mg/dL — ABNORMAL LOW (ref 8.9–10.3)
Creatinine, Ser: 0.84 mg/dL (ref 0.61–1.24)
GFR calc Af Amer: >60 mL/min (ref >60)
GFR calc non Af Amer: >60 mL/min (ref >60)
Glucose, Bld: 169 mg/dL — ABNORMAL HIGH (ref 65–99)
POTASSIUM: 3.8 mmol/L (ref 3.5–5.1)
SODIUM: 136 mmol/L (ref 135–145)

## 2015-08-09 LAB — CBC
HEMATOCRIT: 29.7 % — AB (ref 39.0–52.0)
HEMOGLOBIN: 9.4 g/dL — AB (ref 13.0–17.0)
MCH: 24.2 pg — AB (ref 26.0–34.0)
MCHC: 31.6 g/dL (ref 30.0–36.0)
MCV: 76.3 fL — ABNORMAL LOW (ref 78.0–100.0)
Platelets: 210 10*3/uL (ref 150–400)
RBC: 3.89 MIL/uL — AB (ref 4.22–5.81)
RDW: 18.3 % — ABNORMAL HIGH (ref 11.5–15.5)
WBC: 10.7 10*3/uL — ABNORMAL HIGH (ref 4.0–10.5)

## 2015-08-09 LAB — GLUCOSE, CAPILLARY
GLUCOSE-CAPILLARY: 156 mg/dL — AB (ref 65–99)
GLUCOSE-CAPILLARY: 127 mg/dL — AB (ref 65–99)
GLUCOSE-CAPILLARY: 126 mg/dL — AB (ref 65–99)
Glucose-Capillary: 122 mg/dL — ABNORMAL HIGH (ref 65–99)

## 2015-08-09 LAB — TROPONIN I: TROPONIN I: 0.03 ng/mL (ref <0.031)

## 2015-08-09 MED ORDER — ONDANSETRON HCL 4 MG PO TABS: 4.0000 mg | ORAL_TABLET | Freq: Four times a day (QID) | ORAL | Status: DC | PRN

## 2015-08-09 MED ORDER — IPRATROPIUM-ALBUTEROL 0.5-2.5 (3) MG/3ML IN SOLN: 3.0000 mL | Freq: Four times a day (QID) | RESPIRATORY_TRACT | Status: DC | PRN

## 2015-08-09 MED ORDER — PANTOPRAZOLE SODIUM 40 MG IV SOLR: 40.0000 mg | Freq: Every day | INTRAVENOUS | Status: DC

## 2015-08-09 MED ORDER — ENOXAPARIN SODIUM 40 MG/0.4ML ~~LOC~~ SOLN
40.0000 mg | SUBCUTANEOUS | Status: DC
  Administered 2015-08-09 – 2015-08-13 (×5): 40 mg via SUBCUTANEOUS
  Filled 2015-08-09 (×5): qty 0.4

## 2015-08-09 MED ORDER — ONDANSETRON HCL 4 MG/2ML IJ SOLN: 4.0000 mg | Freq: Four times a day (QID) | INTRAMUSCULAR | Status: DC | PRN

## 2015-08-09 MED ORDER — PANTOPRAZOLE SODIUM 40 MG PO TBEC
40.0000 mg | DELAYED_RELEASE_TABLET | Freq: Every day | ORAL | Status: DC
  Administered 2015-08-09: 40 mg via ORAL
  Filled 2015-08-09: qty 1

## 2015-08-09 MED ORDER — INSULIN ASPART 100 UNIT/ML ~~LOC~~ SOLN
0.0000 [IU] | Freq: Three times a day (TID) | SUBCUTANEOUS | Status: DC
  Administered 2015-08-09 (×2): 2 [IU] via SUBCUTANEOUS
  Administered 2015-08-09: 3 [IU] via SUBCUTANEOUS

## 2015-08-09 MED ORDER — IPRATROPIUM-ALBUTEROL 0.5-2.5 (3) MG/3ML IN SOLN: 3.0000 mL | Freq: Four times a day (QID) | RESPIRATORY_TRACT | Status: DC

## 2015-08-09 MED ORDER — METOPROLOL TARTRATE 1 MG/ML IV SOLN
5.0000 mg | Freq: Four times a day (QID) | INTRAVENOUS | Status: DC | PRN
  Administered 2015-08-09 – 2015-08-14 (×3): 5 mg via INTRAVENOUS
  Filled 2015-08-09 (×3): qty 5

## 2015-08-09 MED ORDER — AMLODIPINE BESYLATE 5 MG PO TABS: 5.0000 mg | ORAL_TABLET | Freq: Every day | ORAL | Status: DC

## 2015-08-09 MED ORDER — KCL IN DEXTROSE-NACL 20-5-0.9 MEQ/L-%-% IV SOLN
INTRAVENOUS | Status: DC
  Administered 2015-08-09 (×2): via INTRAVENOUS
  Filled 2015-08-09 (×3): qty 1000

## 2015-08-09 MED ORDER — AMLODIPINE BESYLATE 5 MG PO TABS
5.0000 mg | ORAL_TABLET | Freq: Every day | ORAL | Status: DC
  Administered 2015-08-09 – 2015-08-12 (×4): 5 mg via ORAL
  Filled 2015-08-09 (×4): qty 1
  Filled 2015-08-09: qty 2

## 2015-08-09 MED ORDER — OXYCODONE-ACETAMINOPHEN 5-325 MG PO TABS
1.0000 | ORAL_TABLET | ORAL | Status: DC | PRN
  Administered 2015-08-09: 2 via ORAL
  Administered 2015-08-09: 1 via ORAL
  Filled 2015-08-09: qty 2
  Filled 2015-08-09: qty 1
  Filled 2015-08-09: qty 2

## 2015-08-09 NOTE — Progress Notes (Signed)
Rapid response called, pulse oxymetry sensor changed. Pt oxygen saturations maintained above 90%. MD called back and gave an order for nebulizer treatment and monitor pt condition

## 2015-08-09 NOTE — Progress Notes (Signed)
Late Entry Called by RN to evaluate patient with low sats in the 70's and requiriing more oxygen via nasal cannula.  RN states never exhibited distress and pulse ox probe was changed.  Upon my arrival to patients room, RN at bedside.  Patient sitting in chair with lunch tray, Patient alert and communicating, denies pain or SOB, asked for pillow to be removed from behind back.  Sats currently 100% on 5 LPM, lowered back down to 2 LPM sats remained 98%.  No wheezing or distress noted.

## 2015-08-09 NOTE — Progress Notes (Signed)
Pt s wife reports that pt has a history of bipolar and psychosis and has not been taking his medications for over 5 years

## 2015-08-09 NOTE — Progress Notes (Signed)
Attempted to call pt's wife on 5N as requested but noone picked up the phone.will call again later this pm
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
## 2015-08-09 NOTE — Progress Notes (Signed)
Md paged again as pt condition remains unstable

## 2015-08-09 NOTE — Progress Notes (Signed)
Prn percocet administered this morning for pain. Pt reluctant to take pain meds, does not say why but just very unwilling to take narcotics especially the morphine

## 2015-08-09 NOTE — Progress Notes (Signed)
 5mg  iv lopressor administered for a high blood pressure of 192/75. Will continue to monitor condition

## 2015-08-09 NOTE — Progress Notes (Signed)
Oxygen levels increased to 5liters . Saturations go up briefly but drop to the 70s and 80s within a short period of time. Pt remains alert and oriented the whole time

## 2015-08-09 NOTE — Progress Notes (Signed)
On-call Trauma MD issued new order for Norvasc 5mg  PO daily.  Patient resting on bed comfortably with both eyes closed but easily aruosable.

## 2015-08-09 NOTE — Progress Notes (Signed)
Finally managed to speak with pt's wife this evening. Wife says that she needs [place] [phone] when pt is getting ready to be discharged home as she needs to be involved in the discharge arrangements. Wife is currently on [address] room 6

## 2015-08-09 NOTE — Progress Notes (Signed)
Pt condition has remained stable .stayed with pt in room. No s/s of respiratory distress noted throughout . Pt alert and able to engage in conversation the whole time. Oxygen weaned back to 3l by rapid response nurse.

## 2015-08-09 NOTE — Progress Notes (Signed)
[facility].  Patient AOx3, pain at 2/10, VS stable but elevated BP at 216/91, O2Sat at 93% at 2L/min.  Patient states, "I do not take any BP medication and my BP is always high.  Paged on-call Trauma MD, Dr. Andrey CampanileWilson and Trauma MD responded my call and will look into it.  Awaiting for further orders.

## 2015-08-09 NOTE — Progress Notes (Signed)
Pt dropping oxygen saturations while sitting up in chair. Oxygen levels not responding to  deep breathing and incentive spirometry.Md paged

## 2015-08-10 DIAGNOSIS — S2220XA Unspecified fracture of sternum, initial encounter for closed fracture: Secondary | ICD-10-CM | POA: Diagnosis not present

## 2015-08-10 DIAGNOSIS — D62 Acute posthemorrhagic anemia: Secondary | ICD-10-CM | POA: Diagnosis not present

## 2015-08-10 LAB — GLUCOSE, CAPILLARY: GLUCOSE-CAPILLARY: 113 mg/dL — AB (ref 65–99)

## 2015-08-10 MED ORDER — POLYETHYLENE GLYCOL 3350 17 G PO PACK
17.0000 g | PACK | Freq: Every day | ORAL | Status: DC
  Administered 2015-08-10 – 2015-08-13 (×4): 17 g via ORAL
  Filled 2015-08-10 (×4): qty 1

## 2015-08-10 MED ORDER — DOCUSATE SODIUM 100 MG PO CAPS
100.0000 mg | ORAL_CAPSULE | Freq: Two times a day (BID) | ORAL | Status: DC
  Administered 2015-08-10 – 2015-08-14 (×9): 100 mg via ORAL
  Filled 2015-08-10 (×9): qty 1

## 2015-08-10 MED ORDER — NAPROXEN 250 MG PO TABS
500.0000 mg | ORAL_TABLET | Freq: Two times a day (BID) | ORAL | Status: DC
Start: 2015-08-10 — End: 2015-08-14
  Administered 2015-08-10 – 2015-08-14 (×7): 500 mg via ORAL
  Filled 2015-08-10 (×10): qty 2

## 2015-08-10 MED ORDER — MORPHINE SULFATE (PF) 2 MG/ML IV SOLN: 2.0000 mg | INTRAVENOUS | Status: DC | PRN

## 2015-08-10 MED ORDER — TRAMADOL HCL 50 MG PO TABS
50.0000 mg | ORAL_TABLET | Freq: Four times a day (QID) | ORAL | Status: DC | PRN
  Administered 2015-08-12: 100 mg via ORAL
  Filled 2015-08-10: qty 2
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
## 2015-08-11 DIAGNOSIS — S2220XA Unspecified fracture of sternum, initial encounter for closed fracture: Secondary | ICD-10-CM | POA: Diagnosis not present

## 2015-08-11 LAB — CBC
HEMATOCRIT: 26.2 % — AB (ref 39.0–52.0)
HEMOGLOBIN: 8.4 g/dL — AB (ref 13.0–17.0)
MCH: 24.7 pg — ABNORMAL LOW (ref 26.0–34.0)
MCHC: 32.1 g/dL (ref 30.0–36.0)
MCV: 77.1 fL — ABNORMAL LOW (ref 78.0–100.0)
Platelets: 183 10*3/uL (ref 150–400)
RBC: 3.4 MIL/uL — AB (ref 4.22–5.81)
RDW: 18.7 % — ABNORMAL HIGH (ref 11.5–15.5)
WBC: 10.2 10*3/uL (ref 4.0–10.5)
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
## 2015-08-12 ENCOUNTER — Encounter (HOSPITAL_COMMUNITY): Payer: Self-pay | Admitting: *Deleted

## 2015-08-12 DIAGNOSIS — S2220XA Unspecified fracture of sternum, initial encounter for closed fracture: Secondary | ICD-10-CM | POA: Diagnosis not present

## 2015-08-12 LAB — CBC
HCT: 26.2 % — ABNORMAL LOW (ref 39.0–52.0)
Hemoglobin: 8.3 g/dL — ABNORMAL LOW (ref 13.0–17.0)
MCH: 24.7 pg — AB (ref 26.0–34.0)
MCHC: 31.7 g/dL (ref 30.0–36.0)
MCV: 78 fL (ref 78.0–100.0)
PLATELETS: 211 10*3/uL (ref 150–400)
RBC: 3.36 MIL/uL — AB (ref 4.22–5.81)
RDW: 18.4 % — ABNORMAL HIGH (ref 11.5–15.5)
WBC: 10.3 10*3/uL (ref 4.0–10.5)

## 2015-08-12 MED ORDER — POLYETHYLENE GLYCOL 3350 17 G PO PACK: 17.0000 g | PACK | Freq: Every day | ORAL | Status: DC | PRN

## 2015-08-12 MED ORDER — BISACODYL 10 MG RE SUPP: 10.0000 mg | Freq: Every day | RECTAL | Status: DC | PRN

## 2015-08-12 MED ORDER — DOCUSATE SODIUM 100 MG PO CAPS: 100.0000 mg | ORAL_CAPSULE | Freq: Two times a day (BID) | ORAL | Status: DC

## 2015-08-12 MED ORDER — IPRATROPIUM-ALBUTEROL 0.5-2.5 (3) MG/3ML IN SOLN: 3.0000 mL | Freq: Four times a day (QID) | RESPIRATORY_TRACT | Status: DC | PRN

## 2015-08-12 MED ORDER — TRAMADOL HCL 50 MG PO TABS: 50.0000 mg | ORAL_TABLET | Freq: Four times a day (QID) | ORAL | Status: DC | PRN

## 2015-08-12 MED ORDER — NAPROXEN 500 MG PO TABS: 500.0000 mg | ORAL_TABLET | Freq: Two times a day (BID) | ORAL | Status: DC
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
## 2015-08-12 NOTE — Discharge Instructions (Signed)
FOLLOW UP WITH YOUR PRIMARY CARE PROVIDER REGARDING YOUR HIGH BLOOD PRESSURE.  Continue taking your Norvasc (blood pressure medicine) unless he has instructed you to stop.    Rib Fracture A rib fracture is a break or crack in one of the bones of the ribs. The ribs are a group of long, curved bones that wrap around your chest and attach to your spine. They protect your lungs and other organs in the chest cavity. A broken or cracked rib is often painful, but most do not cause other problems. Most rib fractures heal on their own over time. However, rib fractures can be more serious if multiple ribs are broken or if broken ribs move out of place and push against other structures. CAUSES   A direct blow to the chest. For example, this could happen during contact sports, a car accident, or a fall against a hard object.  Repetitive movements with high force, such as pitching a baseball or having severe coughing spells. SYMPTOMS   Pain when you breathe in or cough.  Pain when someone presses on the injured area. DIAGNOSIS  Your caregiver will perform a physical exam. Various imaging tests may be ordered to confirm the diagnosis and to look for related injuries. These tests may include a chest X-ray, computed tomography (CT), magnetic resonance imaging (MRI), or a bone scan. TREATMENT  Rib fractures usually heal on their own in 1-3 months. The longer healing period is often associated with a continued cough or other aggravating activities. During the healing period, pain control is very important. Medication is usually given to control pain. Hospitalization or surgery may be needed for more severe injuries, such as those in which multiple ribs are broken or the ribs have moved out of place.  HOME CARE INSTRUCTIONS   Avoid strenuous activity and any activities or movements that cause pain. Be careful during activities and avoid bumping the injured rib.  Gradually increase activity as directed by your  caregiver.  Only take over-the-counter or prescription medications as directed by your caregiver. Do not take other medications without asking your caregiver first.  Apply ice to the injured area for the first 1-2 days after you have been treated or as directed by your caregiver. Applying ice helps to reduce inflammation and pain.  Put ice in a plastic bag.  Place a towel between your skin and the bag.   Leave the ice on for 15-20 minutes at a time, every 2 hours while you are awake.  Perform deep breathing as directed by your caregiver. This will help prevent pneumonia, which is a common complication of a broken rib. Your caregiver may instruct you to:  Take deep breaths several times a day.  Try to cough several times a day, holding a pillow against the injured area.  Use a device called an incentive spirometer to practice deep breathing several times a day.  Drink enough fluids to keep your urine clear or pale yellow. This will help you avoid constipation.   Do not wear a rib belt or binder. These restrict breathing, which can lead to pneumonia.  SEEK IMMEDIATE MEDICAL CARE IF:   You have a fever.   You have difficulty breathing or shortness of breath.   You develop a continual cough, or you cough up thick or bloody sputum.  You feel sick to your stomach (nausea), throw up (vomit), or have abdominal pain.   You have worsening pain not controlled with medications.  MAKE SURE YOU:  Understand  these instructions.  Will watch your condition.  Will get help right away if you are not doing well or get worse.   This information is not intended to replace advice given to you by your health care provider. Make sure you discuss any questions you have with your health care provider.   Document Released: 10/03/2005 Document Revised: 06/05/2013 Document Reviewed: 12/05/2012 Elsevier Interactive Patient Education [employer]
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
## 2015-08-13 DIAGNOSIS — S2220XA Unspecified fracture of sternum, initial encounter for closed fracture: Secondary | ICD-10-CM | POA: Diagnosis not present

## 2015-08-13 MED ORDER — AMLODIPINE BESYLATE 10 MG PO TABS
10.0000 mg | ORAL_TABLET | Freq: Every day | ORAL | Status: DC
  Administered 2015-08-13 – 2015-08-14 (×2): 10 mg via ORAL
  Filled 2015-08-13 (×2): qty 1

## 2015-08-13 NOTE — Clinical Social Work Placement (Signed)
   CLINICAL SOCIAL WORK PLACEMENT  NOTE  Date:  08/13/2015  Patient Details  Name: Nicholas MellowJames M Lickteig Jr. MRN: [id number] Date of Birth: 25-Sep-1928  Clinical Social Work is seeking post-discharge placement for this patient at the Skilled  Nursing Facility level of care (*CSW will initial, date and re-position this form in  chart as items are completed):  Yes   Patient/family provided with [place] Clinical Social Work Department's list of facilities offering this level of care within the geographic area requested by the patient (or if unable, by the patient's family).  Yes   Patient/family informed of their freedom to choose among providers that offer the needed level of care, that participate in Medicare, Medicaid or managed care program needed by the patient, have an available bed and are willing to accept the patient.  Yes   Patient/family informed of [facility]'s ownership interest in [facility] and [facility], as well as of the fact that they are under no obligation to receive care at these facilities.  PASRR submitted to EDS on 08/11/15     PASRR number received on 08/11/15     Existing PASRR number confirmed on       FL2 transmitted to all facilities in geographic area requested by pt/family on 08/12/15     FL2 transmitted to all facilities within larger geographic area on       Patient informed that his/her managed care company has contracts with or will negotiate with certain facilities, including the following:        Yes   Patient/family informed of bed offers received.  Patient chooses bed at       Physician recommends and patient chooses bed at      Patient to be transferred to   on  .  Patient to be transferred to facility by       Patient family notified on   of transfer.  Name of family member notified:        PHYSICIAN Please sign FL2, Please prepare priority discharge summary, including medications     Additional Comment:    Macario GoldsJesse Shaleen Talamantez,  LCSW [phone]

## 2015-08-13 NOTE — Clinical Social Work Placement (Signed)
   CLINICAL SOCIAL WORK PLACEMENT  NOTE  Date:  08/13/2015  Patient Details  Name: Nicholas MellowJames M Boutwell Jr. MRN: [id number] Date of Birth: 08/25/1928  Clinical Social Work is seeking post-discharge placement for this patient at the Skilled  Nursing Facility level of care (*CSW will initial, date and re-position this form in  chart as items are completed):  Yes   Patient/family provided with [place] Clinical Social Work Department's list of facilities offering this level of care within the geographic area requested by the patient (or if unable, by the patient's family).  Yes   Patient/family informed of their freedom to choose among providers that offer the needed level of care, that participate in Medicare, Medicaid or managed care program needed by the patient, have an available bed and are willing to accept the patient.  Yes   Patient/family informed of [facility]'s ownership interest in [facility] and [facility], as well as of the fact that they are under no obligation to receive care at these facilities.  PASRR submitted to EDS on 08/11/15     PASRR number received on 08/11/15     Existing PASRR number confirmed on       FL2 transmitted to all facilities in geographic area requested by pt/family on 08/12/15     FL2 transmitted to all facilities within larger geographic area on       Patient informed that his/her managed care company has contracts with or will negotiate with certain facilities, including the following:        Yes   Patient/family informed of bed offers received.  [facility]     Physician recommends and patient chooses bed at      Patient [facility] on 08/14/15.  Patient to be transferred to facility by Ambulance     Patient family notified on 08/12/15 of transfer.  Name of family member notified:  [name] (patient son)     PHYSICIAN Please sign FL2, Please  prepare priority discharge summary, including medications     Additional Comment:    Macario GoldsJesse Keiyon Plack, LCSW [phone]

## 2015-08-13 NOTE — Clinical Social Work Note (Signed)
Clinical Social Worker continuing to follow patient and family for support and discharge planning needs. CSW spoke with patient and patient son at bedside to further discuss bed offers. Patient son went to visit facilities and has chosen [facility]. CSW spoke with facility who is agreeable with patient admission tomorrow. Patient spouse will also discharge to [facility] tomorrow. CSW remains available for support and to facilitate patient discharge needs.  Jesse Daeron Carreno, LCSW [phone] 
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
## 2015-08-14 DIAGNOSIS — S2220XA Unspecified fracture of sternum, initial encounter for closed fracture: Secondary | ICD-10-CM | POA: Diagnosis not present
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
## 2015-08-14 NOTE — Clinical Social Work Placement (Signed)
   CLINICAL SOCIAL WORK PLACEMENT  NOTE  Date:  08/14/2015  Patient Details  Name: Nicholas MellowJames M Upshur Jr. MRN: [id number] Date of Birth: Jun 28, 1928  Clinical Social Work is seeking post-discharge placement for this patient at the Skilled  Nursing Facility level of care (*CSW will initial, date and re-position this form in  chart as items are completed):  Yes   Patient/family provided with [facility] Clinical Social Work Department's list of facilities offering this level of care within the geographic area requested by the patient (or if unable, by the patient's family).  Yes   Patient/family informed of their freedom to choose among providers that offer the needed level of care, that participate in Medicare, Medicaid or managed care program needed by the patient, have an available bed and are willing to accept the patient.  Yes   Patient/family informed of [facility]'s ownership interest in [facility] and [facility], as well as of the fact that they are under no obligation to receive care at these facilities.  PASRR submitted to EDS on 08/11/15     PASRR number received on 08/11/15     Existing PASRR number confirmed on       FL2 transmitted to all facilities in geographic area requested by pt/family on 08/12/15     FL2 transmitted to all facilities within larger geographic area on       Patient informed that his/her managed care company has contracts with or will negotiate with certain facilities, including the following:        Yes   Patient/family informed of bed offers received.  [facility]     Physician recommends and patient chooses bed at      [facility] 08[facility]14[facility]15.  Patient to be transferred to facility by Ambulance     Patient family notified on 08/14/15 of transfer.  Name of family member notified:  Worthy RancherJim Minion (patient son)     PHYSICIAN Please sign FL2, Please  prepare priority discharge summary, including medications     Additional Comment:  Per MD patient ready for DC to [facility]. RN, patient, patient's family, and facility notified of DC. RN given number for report. DC packet on chart. Ambulance transport requested for patient for 2:00PM. CSW signing off.   _______________________________________________ Venita Lickampbell, Sherylann Vangorden B, LCSW 08/14/2015, 1:10 PM

## 2015-08-16 NOTE — Progress Notes (Signed)
OT NOTE- LATE GCODE ENTRY    08/11/15 1100  OT G-codes **NOT FOR INPATIENT CLASS**  Functional Assessment Tool Used clinical judgement  Functional Limitation Self care  Self Care Current Status (W0981(G8987) CL  Self Care Goal Status (X9147(G8988) CL  Self Care Discharge Status (W2956(G8989) [place] CompL   [name], [name]   OTR/L Pager: [phone] Office: [phone] .

## 2015-08-24 ENCOUNTER — Non-Acute Institutional Stay (SKILLED_NURSING_FACILITY): Payer: Medicare Other | Admitting: Internal Medicine

## 2015-08-24 ENCOUNTER — Encounter: Payer: Self-pay | Admitting: Internal Medicine

## 2015-08-24 DIAGNOSIS — S2249XA Multiple fractures of ribs, unspecified side, initial encounter for closed fracture: Secondary | ICD-10-CM | POA: Diagnosis not present

## 2015-08-24 DIAGNOSIS — S2220XA Unspecified fracture of sternum, initial encounter for closed fracture: Secondary | ICD-10-CM

## 2015-08-24 DIAGNOSIS — I10 Essential (primary) hypertension: Secondary | ICD-10-CM
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
## 2015-09-04 ENCOUNTER — Non-Acute Institutional Stay (SKILLED_NURSING_FACILITY): Payer: Medicare Other | Admitting: Adult Health

## 2015-09-04 DIAGNOSIS — S2249XS Multiple fractures of ribs, unspecified side, sequela: Secondary | ICD-10-CM

## 2015-09-04 DIAGNOSIS — S2220XS Unspecified fracture of sternum, sequela: Secondary | ICD-10-CM | POA: Diagnosis not present

## 2015-09-06 ENCOUNTER — Encounter: Payer: Self-pay | Admitting: Adult Health

## 2015-09-06 MED ORDER — TRAMADOL HCL 50 MG PO TABS: 50.0000 mg | ORAL_TABLET | Freq: Four times a day (QID) | ORAL | Status: DC | PRN

## 2015-09-06 NOTE — Progress Notes (Signed)
Patient ID: Nicholas MellowJames M Sundberg Jr., male   DOB: 08/24/1928, 81 y.o.   MRN: [id number]    [facility]      No Known Allergies  Chief Complaint  Patient presents with  . Discharge Note    HPI:  [facility] is being [facility] The facility will setup therapy for him. He will follow up medically with the provider at that facility. He has a walker will need 3:1 commode and will need prescription written for ultram.     Past Medical History  Diagnosis Date  . History of skin cancer   . Sialolithiasis   . Osteoarthrosis, unspecified whether generalized or localized, shoulder region   . Bipolar disorder, unspecified (HCC)   . Unspecified essential hypertension   . Macular degeneration, age related, exudative (HCC)     revieving intra-ocular injections left eye    Past Surgical History  Procedure Laterality Date  . Hemorrhoid surgery  1968    VITAL SIGNS BP 148/90 mmHg  Pulse 78  Ht 5\' 7"  (1.702 m)  Wt 143 lb (64.864 kg)  BMI 22.39 kg/m2  Patient's Medications  New Prescriptions   No medications on file  Previous Medications   ACETAMINOPHEN (TYLENOL) 325 MG TABLET    Take 2 tablets (650 mg total) by mouth every 4 (four) hours as needed.   AMLODIPINE (NORVASC) 10 MG TABLET    Take 1 tablet (10 mg total) by mouth daily. Calcium channel blocker for BP   ASCORBIC ACID (VITAMIN C-ACEROLA) 500 MG CHEW    Chew 2,000 mg by mouth daily.     CHOLECALCIFEROL 1000 UNITS TABLET    Take 1,000 Units by mouth daily.     [employer] ZINC 50 MG TABS    Take 50 mg by mouth 2 (two) times daily.    DOCUSATE SODIUM (COLACE) 100 MG CAPSULE    Take 1 capsule (100 mg total) by mouth 2 (two) times daily.   FOLIC ACID-VIT B6-VIT B12 2.2-25-0.5 MG TABS    Take 1 tablet by mouth daily.     FUROSEMIDE (LASIX) 40 MG TABLET    Take 1 tablet (40 mg total) by mouth daily. A diuretic for BP   GLUCOSAMINE 500 MG TABS    Take 500 mg by mouth daily.    IPRATROPIUM-ALBUTEROL (DUONEB) 0.5-2.5 (3) MG/3ML SOLN    Take 3 mLs by nebulization every 6 (six) hours as needed.   MULTIPLE VITAMINS-MINERALS (MULTIVITAMIN,TX-MINERALS) TABLET    Take 1 tablet by mouth daily.     NAPROXEN (NAPROSYN) 500 MG TABLET    Take 1 tablet (500 mg total) by mouth 2 (two) times daily with a meal.   POLYETHYLENE GLYCOL (MIRALAX / GLYCOLAX) PACKET    Take 17 g by mouth daily as needed.   SELENIUM 100 MCG TABS    Take 100 mcg by mouth daily.    TRAMADOL (ULTRAM) 50 MG TABLET    Take 1-2 tablets (50-100 mg total) by mouth every 6 (six) hours as needed (50mg  for mild pain, 75mg  for moderate pain, 100mg  for severe pain).   VITAMIN A 4098110000 UNIT CAPSULE    Take 10,000 Units by mouth daily.    VITAMIN E 800 UNIT CAPSULE    Take 800 Units by mouth daily.    Modified Medications   No medications on file  Discontinued Medications   No medications on file     SIGNIFICANT DIAGNOSTIC EXAMS   Review of Systems  Constitutional: Negative  for malaise/fatigue.  Respiratory: Negative for cough and shortness of breath.   Cardiovascular: Negative for chest pain and palpitations.  Gastrointestinal: Negative for abdominal pain and constipation.  Musculoskeletal: Negative for myalgias and joint pain.  Skin: Negative.   Psychiatric/Behavioral: The patient is not nervous/anxious.     Physical Exam  Constitutional: No distress.  Eyes: Conjunctivae are normal.  Neck: Neck supple. No JVD present. No thyromegaly present.  Cardiovascular: Normal rate, regular rhythm and intact distal pulses.   Respiratory: Effort normal and breath sounds normal. No respiratory distress. He has no wheezes.  GI: Soft. Bowel sounds are normal. He exhibits no distension. There is no tenderness.  Musculoskeletal: He exhibits no edema.  Able to move all extremities   Lymphadenopathy:    He has no cervical adenopathy.  Neurological: He is alert.  Skin: Skin is warm and dry. He is not diaphoretic.    Psychiatric: He has a normal mood and affect.     ASSESSMENT/ PLAN:   Will discharge him to assisted living facility. The facility will setup therapy. He will follow with medical provider at the facility. He will need 3:1 commode. A prescription was written for #60 ultram 50 mg tabs.   Time spent with patient  40 minutes >50% time spent counseling; reviewing medical record; tests; labs; and developing future plan of care     Synthia Innocent NP [facility]  Contact [phone] Monday through Friday 8am- 5pm  After hours call [phone]

## 2015-10-08 ENCOUNTER — Emergency Department (HOSPITAL_COMMUNITY)
Admission: EM | Admit: 2015-10-08 | Discharge: 2015-10-09 | Disposition: A | Discharge status: Home / Self Care | Payer: Medicare Other | Attending: Emergency Medicine | Admitting: Emergency Medicine

## 2015-10-08 ENCOUNTER — Encounter (HOSPITAL_COMMUNITY): Payer: Self-pay

## 2015-10-08 DIAGNOSIS — I10 Essential (primary) hypertension: Secondary | ICD-10-CM | POA: Diagnosis not present

## 2015-10-08 DIAGNOSIS — Z791 Long term (current) use of non-steroidal anti-inflammatories (NSAID): Secondary | ICD-10-CM | POA: Diagnosis not present

## 2015-10-08 DIAGNOSIS — Z8669 Personal history of other diseases of the nervous system and sense organs: Secondary | ICD-10-CM | POA: Insufficient documentation

## 2015-10-08 DIAGNOSIS — M19019 Primary osteoarthritis, unspecified shoulder: Secondary | ICD-10-CM | POA: Diagnosis not present

## 2015-10-08 DIAGNOSIS — R410 Disorientation, unspecified: Secondary | ICD-10-CM | POA: Insufficient documentation

## 2015-10-08 DIAGNOSIS — Z8719 Personal history of other diseases of the digestive system: Secondary | ICD-10-CM | POA: Insufficient documentation

## 2015-10-08 DIAGNOSIS — E860 Dehydration: Secondary | ICD-10-CM | POA: Insufficient documentation

## 2015-10-08 DIAGNOSIS — Z85828 Personal history of other malignant neoplasm of skin: Secondary | ICD-10-CM | POA: Insufficient documentation

## 2015-10-08 DIAGNOSIS — F319 Bipolar disorder, unspecified: Secondary | ICD-10-CM | POA: Insufficient documentation

## 2015-10-08 DIAGNOSIS — Z79899 Other long term (current) drug therapy: Secondary | ICD-10-CM | POA: Diagnosis not present

## 2015-10-08 DIAGNOSIS — R4182 Altered mental status, unspecified: Secondary | ICD-10-CM | POA: Diagnosis present

## 2015-10-08 LAB — LACTIC ACID, PLASMA: LACTIC ACID, VENOUS: 0.8 mmol/L (ref 0.5–2.0)

## 2015-10-08 LAB — URINALYSIS, ROUTINE W REFLEX MICROSCOPIC
BILIRUBIN URINE: NEGATIVE
Glucose, UA: NEGATIVE mg/dL
Hgb urine dipstick: NEGATIVE
KETONES UR: NEGATIVE mg/dL
LEUKOCYTES UA: NEGATIVE
NITRITE: NEGATIVE
PH: 5.5 (ref 5.0–8.0)
PROTEIN: NEGATIVE mg/dL
Specific Gravity, Urine: 1.026 (ref 1.005–1.030)

## 2015-10-08 LAB — CBC WITH DIFFERENTIAL/PLATELET
BASOS ABS: 0 10*3/uL (ref 0.0–0.1)
Basophils Relative: 0 %
EOS PCT: 6 %
Eosinophils Absolute: 0.4 10*3/uL (ref 0.0–0.7)
HCT: 33.5 % — ABNORMAL LOW (ref 39.0–52.0)
HEMOGLOBIN: 11 g/dL — AB (ref 13.0–17.0)
LYMPHS ABS: 1.9 10*3/uL (ref 0.7–4.0)
Lymphocytes Relative: 26 %
MCH: 26.8 pg (ref 26.0–34.0)
MCHC: 32.8 g/dL (ref 30.0–36.0)
MCV: 81.7 fL (ref 78.0–100.0)
MONO ABS: 0.6 10*3/uL (ref 0.1–1.0)
MONOS PCT: 9 %
NEUTROS ABS: 4.4 10*3/uL (ref 1.7–7.7)
NEUTROS PCT: 59 %
PLATELETS: 263 10*3/uL (ref 150–400)
RBC: 4.1 MIL/uL — AB (ref 4.22–5.81)
RDW: 18.8 % — ABNORMAL HIGH (ref 11.5–15.5)
WBC: 7.3 10*3/uL (ref 4.0–10.5)

## 2015-10-08 LAB — COMPREHENSIVE METABOLIC PANEL
ALT: 20 U/L (ref 17–63)
AST: 25 U/L (ref 15–41)
Albumin: 4.1 g/dL (ref 3.5–5.0)
Alkaline Phosphatase: 82 U/L (ref 38–126)
Anion gap: 8 (ref 5–15)
BILIRUBIN TOTAL: 0.5 mg/dL (ref 0.3–1.2)
BUN: 39 mg/dL — AB (ref 6–20)
CO2: 23 mmol/L (ref 22–32)
Calcium: 9.2 mg/dL (ref 8.9–10.3)
Chloride: 110 mmol/L (ref 101–111)
Creatinine, Ser: 1.31 mg/dL — ABNORMAL HIGH (ref 0.61–1.24)
GFR, EST AFRICAN AMERICAN: 56 mL/min — AB (ref >60)
GFR, EST NON AFRICAN AMERICAN: 48 mL/min — AB (ref >60)
Glucose, Bld: 103 mg/dL — ABNORMAL HIGH (ref 65–99)
POTASSIUM: 4.3 mmol/L (ref 3.5–5.1)
Sodium: 141 mmol/L (ref 135–145)
TOTAL PROTEIN: 6.9 g/dL (ref 6.5–8.1)

## 2015-10-08 LAB — AMMONIA: Ammonia: 9 umol/L (ref 9–35)

## 2015-10-08 LAB — ETHANOL

## 2015-10-08 NOTE — ED Provider Notes (Signed)
CSN: [id number]     Arrival date & time 10/08/15  2149 History   First MD Initiated Contact with Patient 10/08/15 2158     Chief Complaint  Patient presents with  . Altered Mental Status     (Consider location/radiation/quality/duration/timing/severity/associated sxs/prior Treatment) HPI   Nicholas MellowJames M Wheatley Jr. is a 81 y.o. male who presents for evaluation of confusion, suspicious behavior, and threatening staff at his skilled nursing facility. He apparently thought he heard voices and his wife's room and that bothered him. He is unable to specify why he is here. He states he came by EMS.  Level V caveat- confusion   Past Medical History  Diagnosis Date  . History of skin cancer   . Sialolithiasis   . Osteoarthrosis, unspecified whether generalized or localized, shoulder region   . Bipolar disorder, unspecified (HCC)   . Unspecified essential hypertension   . Macular degeneration, age related, exudative (HCC)     revieving intra-ocular injections left eye   Past Surgical History  Procedure Laterality Date  . Hemorrhoid surgery  1968   Family History  Problem Relation Age of Onset  . Lymphoma Mother   . Colon cancer Sister   . COPD Sister   . Diabetes Neg Hx   . Hyperlipidemia Neg Hx   . Heart disease Neg Hx   . Cancer Sister     colon cancer   Social History  Substance Use Topics  . Smoking status: Never Smoker   . Smokeless tobacco: None  . Alcohol Use: 3.5 oz/week    7 drink(s) per week    Review of Systems  Unable to perform ROS: Mental status change      Allergies  Review of patient's allergies indicates no known allergies.  Home Medications   Prior to Admission medications   Medication Sig Start Date End Date Taking? Authorizing Provider  acetaminophen (TYLENOL) 325 MG tablet Take 2 tablets (650 mg total) by mouth every 4 (four) hours as needed. Patient not taking: Reported on 08/08/2015 07/19/13   Ashok NorrisEmina Riebock, NP  amLODipine (NORVASC) 10 MG tablet  Take 1 tablet (10 mg total) by mouth daily. Calcium channel blocker for BP Patient not taking: Reported on 08/08/2015 07/31/13   Jacques NavyMichael E Norins, MD  Ascorbic Acid (VITAMIN C-ACEROLA) 500 MG CHEW Chew 2,000 mg by mouth daily.      Historical Provider, MD  Cholecalciferol 1000 UNITS tablet Take 1,000 Units by mouth daily.      Historical Provider, MD  CVS Zinc 50 MG TABS Take 50 mg by mouth 2 (two) times daily.     Historical Provider, MD  docusate sodium (COLACE) 100 MG capsule Take 1 capsule (100 mg total) by mouth 2 (two) times daily. 08/12/15   Nonie HoyerMegan N Baird, PA-C  Folic Acid-Vit B6-Vit B12 2.2-25-0.5 MG TABS Take 1 tablet by mouth daily.      Historical Provider, MD  furosemide (LASIX) 40 MG tablet Take 1 tablet (40 mg total) by mouth daily. A diuretic for BP Patient not taking: Reported on 08/08/2015 07/31/13   Jacques NavyMichael E Norins, MD  Glucosamine 500 MG TABS Take 500 mg by mouth daily.     Historical Provider, MD  ipratropium-albuterol (DUONEB) 0.5-2.5 (3) MG/3ML SOLN Take 3 mLs by nebulization every 6 (six) hours as needed. 08/12/15   Nonie HoyerMegan N Baird, PA-C  Multiple Vitamins-Minerals (MULTIVITAMIN,TX-MINERALS) tablet Take 1 tablet by mouth daily.      Historical Provider, MD  naproxen (NAPROSYN) 500 MG tablet Take 1  tablet (500 mg total) by mouth 2 (two) times daily with a meal. 08/12/15   Nonie Hoyer, PA-C  polyethylene glycol (MIRALAX / GLYCOLAX) packet Take 17 g by mouth daily as needed. 08/12/15   Nonie Hoyer, PA-C  Selenium 100 MCG TABS Take 100 mcg by mouth daily.     Historical Provider, MD  traMADol (ULTRAM) 50 MG tablet Take 1-2 tablets (50-100 mg total) by mouth every 6 (six) hours as needed (  for mild pain,  for moderate pain,  for severe pain). THIS PRESCRIPTION WILL SENT WITH HIM TO THE ASSISTED LIVING UPON DISCHARGE FROM [facility] 09/06/15   Sharee Holster, NP  vitamin A 54098 UNIT capsule Take 10,000 Units by mouth daily.     Historical Provider, MD  vitamin E  800 UNIT capsule Take 800 Units by mouth daily.      Historical Provider, MD   BP 164/76 mmHg  Pulse 74  Temp(Src) 98.9 F (37.2 C) (Oral)  SpO2 93% Physical Exam  Constitutional: He appears well-developed and well-nourished. No distress (He is jovial).  HENT:  Head: Normocephalic and atraumatic.  Right Ear: External ear normal.  Left Ear: External ear normal.  Eyes: Conjunctivae and EOM are normal. Pupils are equal, round, and reactive to Ellers.  Neck: Normal range of motion and phonation normal. Neck supple.  Cardiovascular: Normal rate, regular rhythm and normal heart sounds.   Pulmonary/Chest: Effort normal and breath sounds normal. He exhibits no bony tenderness.  Abdominal: Soft. There is no tenderness.  Musculoskeletal: Normal range of motion.  Neurological: He is alert. No cranial nerve deficit or sensory deficit. He exhibits normal muscle tone. Coordination normal.  Skin: Skin is warm, dry and intact.  Psychiatric: He has a normal mood and affect. His behavior is normal.  Nursing note and vitals reviewed.   ED Course  Procedures (including critical care time) Labs Review Labs Reviewed  COMPREHENSIVE METABOLIC PANEL - Abnormal; Notable for the following:    Glucose, Bld 103 (*)    BUN 39 (*)    Creatinine, Ser 1.31 (*)    GFR calc non Af Amer 48 (*)    GFR calc Af Amer 56 (*)    All other components within normal limits  CBC WITH DIFFERENTIAL/PLATELET - Abnormal; Notable for the following:    RBC 4.10 (*)    Hemoglobin 11.0 (*)    HCT 33.5 (*)    RDW 18.8 (*)    All other components within normal limits  URINE CULTURE  AMMONIA  ETHANOL  LACTIC ACID, PLASMA  URINALYSIS, ROUTINE W REFLEX MICROSCOPIC (NOT AT [facility])   BUN  Date Value Ref Range Status  10/08/2015 39* 6 - 20 mg/dL Final  11/91/4782 24* 6 - 20 mg/dL Final  95/62/1308 29* 6 - 20 mg/dL Final  65/78/4696 17 6 - 23 mg/dL Final   CREATININE, SER  Date Value Ref Range Status  10/08/2015 1.31* 0.61 -  1.24 mg/dL Final  29/52/8413 2.44 0.61 - 1.24 mg/dL Final  10/19/7251 6.64 0.61 - 1.24 mg/dL Final  40/34/7425 9.56 0.50 - 1.35 mg/dL Final      Imaging Review No results found. I have personally reviewed and evaluated these images and lab results as part of my medical decision-making.   EKG Interpretation   Date/Time:  Thursday October 08 2015 22:11:24 EST Ventricular Rate:  72 PR Interval:  180 QRS Duration: 107 QT Interval:  400 QTC Calculation: 438 R Axis:   46 Text Interpretation:  Sinus rhythm Low voltage, precordial leads since  last tracing no significant change Confirmed by [facility]  MD, Delbra Zellars [phone])  on 10/08/2015 11:52:28 PM      MDM   Final diagnoses:  Confusion  Dehydration    Nonspecific confusion and agitation. Patient, here, cooperative, and tolerating oral liquids and food. Mild dehydration, with creatinine elevated above baseline, however, since he is tolerating oral nutrition this can be repleted at his SNF.  Nursing Notes Reviewed/ Care Coordinated, and agree without changes. Applicable Imaging Reviewed.  Interpretation of Laboratory Data incorporated into ED treatment   Nursing Notes Reviewed/ Care Coordinated Applicable Imaging Reviewed Interpretation of Laboratory Data incorporated into ED treatment  The patient appears reasonably screened and/or stabilized for discharge and I doubt any other medical condition or other [facility] requiring further screening, evaluation, or treatment in the ED at this time prior to discharge.  Plan: Home Medications- usual; Home Treatments- rest; return here if the recommenAnd checkupded treatment, does not improve the symptoms; Recommended follow up- PCP 1 week for check up and repeat Creatinine testing, and check up     Mancel Bale, MD 10/09/15 2341
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
## 2015-10-08 NOTE — ED Notes (Signed)
Bed: [id number] Expected date:  Expected time:  Means of arrival:  Comments: EMS 81yo M altered mental status

## 2015-10-09 LAB — CBG MONITORING, ED: Glucose-Capillary: 172 mg/dL — ABNORMAL HIGH (ref 65–99)

## 2015-10-09 NOTE — Discharge Instructions (Signed)
Confusion Confusion is the inability to think with your usual speed or clarity. Confusion may come on quickly or slowly over time. How quickly the confusion comes on depends on the cause. Confusion can be due to any number of causes. CAUSES   Concussion, head injury, or head trauma.  Seizures.  Stroke.  Fever.  Brain tumor.  Age related decreased brain function (dementia).  Heightened emotional states like rage or terror.  Mental illness in which the person loses the ability to determine what is real and what is not (hallucinations).  Infections such as a urinary tract infection (UTI).  Toxic effects from alcohol, drugs, or prescription medicines.  Dehydration and an imbalance of salts in the body (electrolytes).  Lack of sleep.  Low blood sugar (diabetes).  Low levels of oxygen from conditions such as chronic lung disorders.  Drug interactions or other medicine side effects.  Nutritional deficiencies, especially niacin, thiamine, vitamin C, or vitamin B.  Sudden drop in body temperature (hypothermia).  Change in routine, such as when traveling or hospitalized. SIGNS AND SYMPTOMS  People often describe their thinking as cloudy or unclear when they are confused. Confusion can also include feeling disoriented. That means you are unaware of where or who you are. You may also not know what the date or time is. If confused, you may also have difficulty paying attention, remembering, and making decisions. Some people also act aggressively when they are confused.  DIAGNOSIS  The medical evaluation of confusion may include:  Blood and urine tests.  X-rays.  Brain and nervous system tests.  Analyzing your brain waves (electroencephalogram or EEG).  Magnetic resonance imaging (MRI) of your head.  Computed tomography (CT) scan of your head.  Mental status tests in which your health care provider may ask many questions. Some of these questions may seem silly or strange,  but they are a very important test to help diagnose and treat confusion. TREATMENT  An admission to the hospital may not be needed, but a person with confusion should not be left alone. Stay with a family member or friend until the confusion clears. Avoid alcohol, pain relievers, or sedative drugs until you have fully recovered. Do not drive until directed by your health care provider. HOME CARE INSTRUCTIONS  What family and friends can do:  To find out if someone is confused, ask the person to state his or her name, age, and the date. If the person is unsure or answers incorrectly, he or she is confused.  Always introduce yourself, no matter how well the person knows you.  Often remind the person of his or her location.  Place a calendar and clock near the confused person.  Help the person with his or her medicines. You may want to use a pill box, an alarm as a reminder, or give the person each dose as prescribed.  Talk about current events and plans for the day.  Try to keep the environment calm, quiet, and peaceful.  Make sure the person keeps follow-up visits with his or her health care provider. PREVENTION  Ways to prevent confusion:  Avoid alcohol.  Eat a balanced diet.  Get enough sleep.  Take medicine only as directed by your health care provider.  Do not become isolated. Spend time with other people and make plans for your days.  Keep careful watch on your blood sugar levels if you are diabetic. SEEK IMMEDIATE MEDICAL CARE IF:   You develop severe headaches, repeated vomiting, seizures, blackouts, or   slurred speech.  There is increasing confusion, weakness, numbness, restlessness, or personality changes.  You develop a loss of balance, have marked dizziness, feel uncoordinated, or fall.  You have delusions, hallucinations, or develop severe anxiety.  Your family members think you need to be rechecked.   This information is not intended to replace advice given  to you by your health care provider. Make sure you discuss any questions you have with your health care provider.   Document Released: 11/10/2004 Document Revised: 10/24/2014 Document Reviewed: 11/08/2013 Elsevier Interactive Patient Education 2016 [employer]  

## 2015-10-09 NOTE — ED Notes (Signed)
[facility] [place] [facility]

## 2015-10-10 LAB — URINE CULTURE: Culture: 6000

## 2015-10-15 ENCOUNTER — Emergency Department (HOSPITAL_COMMUNITY): Payer: Medicare Other

## 2015-10-15 ENCOUNTER — Emergency Department (HOSPITAL_COMMUNITY)
Admission: EM | Admit: 2015-10-15 | Discharge: 2015-10-16 | Disposition: A | Discharge status: Other Acute Inpatient Hospital | Payer: Medicare Other | Attending: Emergency Medicine | Admitting: Emergency Medicine

## 2015-10-15 ENCOUNTER — Encounter (HOSPITAL_COMMUNITY): Payer: Self-pay | Admitting: Emergency Medicine

## 2015-10-15 DIAGNOSIS — Z79899 Other long term (current) drug therapy: Secondary | ICD-10-CM | POA: Insufficient documentation

## 2015-10-15 DIAGNOSIS — Z8669 Personal history of other diseases of the nervous system and sense organs: Secondary | ICD-10-CM | POA: Insufficient documentation

## 2015-10-15 DIAGNOSIS — M19019 Primary osteoarthritis, unspecified shoulder: Secondary | ICD-10-CM | POA: Insufficient documentation

## 2015-10-15 DIAGNOSIS — Z008 Encounter for other general examination: Secondary | ICD-10-CM | POA: Diagnosis present

## 2015-10-15 DIAGNOSIS — Z8719 Personal history of other diseases of the digestive system: Secondary | ICD-10-CM | POA: Diagnosis not present

## 2015-10-15 DIAGNOSIS — Z791 Long term (current) use of non-steroidal anti-inflammatories (NSAID): Secondary | ICD-10-CM | POA: Diagnosis not present

## 2015-10-15 DIAGNOSIS — I10 Essential (primary) hypertension: Secondary | ICD-10-CM | POA: Diagnosis not present

## 2015-10-15 DIAGNOSIS — Z85828 Personal history of other malignant neoplasm of skin: Secondary | ICD-10-CM | POA: Insufficient documentation

## 2015-10-15 DIAGNOSIS — F3164 Bipolar disorder, current episode mixed, severe, with psychotic features: Secondary | ICD-10-CM | POA: Diagnosis not present

## 2015-10-15 DIAGNOSIS — F319 Bipolar disorder, unspecified: Secondary | ICD-10-CM | POA: Diagnosis present

## 2015-10-15 DIAGNOSIS — F22 Delusional disorders: Secondary | ICD-10-CM

## 2015-10-15 LAB — RAPID URINE DRUG SCREEN, HOSP PERFORMED
Amphetamines: NOT DETECTED
Barbiturates: NOT DETECTED
Benzodiazepines: NOT DETECTED
Cocaine: NOT DETECTED
Opiates: NOT DETECTED
Tetrahydrocannabinol: NOT DETECTED

## 2015-10-15 LAB — COMPREHENSIVE METABOLIC PANEL
ALBUMIN: 4.9 g/dL (ref 3.5–5.0)
ALT: 28 U/L (ref 17–63)
ANION GAP: 10 (ref 5–15)
AST: 29 U/L (ref 15–41)
Alkaline Phosphatase: 83 U/L (ref 38–126)
BILIRUBIN TOTAL: 0.6 mg/dL (ref 0.3–1.2)
BUN: 38 mg/dL — ABNORMAL HIGH (ref 6–20)
CHLORIDE: 108 mmol/L (ref 101–111)
CO2: 23 mmol/L (ref 22–32)
Calcium: 9.7 mg/dL (ref 8.9–10.3)
Creatinine, Ser: 1.29 mg/dL — ABNORMAL HIGH (ref 0.61–1.24)
GFR calc Af Amer: 57 mL/min — ABNORMAL LOW (ref >60)
GFR calc non Af Amer: 49 mL/min — ABNORMAL LOW (ref >60)
GLUCOSE: 152 mg/dL — AB (ref 65–99)
POTASSIUM: 4 mmol/L (ref 3.5–5.1)
Sodium: 141 mmol/L (ref 135–145)
TOTAL PROTEIN: 7.7 g/dL (ref 6.5–8.1)

## 2015-10-15 LAB — CBC
HCT: 34.8 % — ABNORMAL LOW (ref 39.0–52.0)
Hemoglobin: 11.3 g/dL — ABNORMAL LOW (ref 13.0–17.0)
MCH: 26.2 pg (ref 26.0–34.0)
MCHC: 32.5 g/dL (ref 30.0–36.0)
MCV: 80.6 fL (ref 78.0–100.0)
PLATELETS: 284 10*3/uL (ref 150–400)
RBC: 4.32 MIL/uL (ref 4.22–5.81)
RDW: 18.4 % — AB (ref 11.5–15.5)
WBC: 8.1 10*3/uL (ref 4.0–10.5)

## 2015-10-15 LAB — URINALYSIS, ROUTINE W REFLEX MICROSCOPIC
BILIRUBIN URINE: NEGATIVE
Glucose, UA: NEGATIVE mg/dL
Hgb urine dipstick: NEGATIVE
Ketones, ur: NEGATIVE mg/dL
LEUKOCYTES UA: NEGATIVE
Nitrite: NEGATIVE
Protein, ur: NEGATIVE mg/dL
Specific Gravity, Urine: 1.016 (ref 1.005–1.030)
pH: 5 (ref 5.0–8.0)

## 2015-10-15 LAB — SALICYLATE LEVEL: Salicylate Lvl: <4 mg/dL (ref 2.8–30.0)

## 2015-10-15 LAB — ACETAMINOPHEN LEVEL: Acetaminophen (Tylenol), Serum: <10 ug/mL — ABNORMAL LOW (ref 10–30)

## 2015-10-15 LAB — ETHANOL

## 2015-10-15 MED ORDER — AMLODIPINE BESYLATE 10 MG PO TABS
10.0000 mg | ORAL_TABLET | Freq: Every day | ORAL | Status: DC
  Administered 2015-10-15: 10 mg via ORAL
  Filled 2015-10-15 (×3): qty 1

## 2015-10-15 MED ORDER — FUROSEMIDE 40 MG PO TABS
40.0000 mg | ORAL_TABLET | Freq: Every day | ORAL | Status: DC
  Administered 2015-10-15: 40 mg via ORAL
  Filled 2015-10-15: qty 1

## 2015-10-15 MED ORDER — IBUPROFEN 200 MG PO TABS
600.0000 mg | ORAL_TABLET | Freq: Three times a day (TID) | ORAL | Status: DC | PRN
Start: 2015-10-15 — End: 2015-10-16

## 2015-10-15 MED ORDER — ONDANSETRON HCL 4 MG PO TABS: 4.0000 mg | ORAL_TABLET | Freq: Three times a day (TID) | ORAL | Status: DC | PRN

## 2015-10-15 MED ORDER — DOCUSATE SODIUM 100 MG PO CAPS
100.0000 mg | ORAL_CAPSULE | Freq: Two times a day (BID) | ORAL | Status: DC
  Administered 2015-10-15: 100 mg via ORAL
  Filled 2015-10-15: qty 1

## 2015-10-15 MED ORDER — POLYETHYLENE GLYCOL 3350 17 G PO PACK
17.0000 g | PACK | Freq: Every day | ORAL | Status: DC | PRN
  Filled 2015-10-15: qty 1

## 2015-10-15 MED ORDER — LORAZEPAM 1 MG PO TABS: 1.0000 mg | ORAL_TABLET | Freq: Three times a day (TID) | ORAL | Status: DC | PRN

## 2015-10-15 MED ORDER — IPRATROPIUM-ALBUTEROL 0.5-2.5 (3) MG/3ML IN SOLN: 3.0000 mL | Freq: Four times a day (QID) | RESPIRATORY_TRACT | Status: DC | PRN
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
## 2015-10-15 NOTE — Progress Notes (Signed)
CSW reached out to [facility] who confirms that they have offered the patient a bed pending IVC paperwork.  CSW made nurse aware.  Trish MageBrittney Denice Cardon, LCSWA [phone] ED CSW 10/15/2015 10:10 PM

## 2015-10-15 NOTE — ED Provider Notes (Signed)
CSN: [id number]     Arrival date & time 10/15/15  1612 History   First MD Initiated Contact with Patient 10/15/15 1807     Chief Complaint  Patient presents with  . IVC      (Consider location/radiation/quality/duration/timing/severity/associated sxs/prior Treatment) HPI Comments: PT comes in with cc of confusion. Pt has hx of bipolar disorder. Pt sent here from the facility he is residing in because he is being aggressive towards his wife and threatening to kill her. Pt reports that his wife is sleeping with men at the facility, and that he saw someone in her bed - but that the man left before he got close. He sleeps in a different room. He denies hearing any voices or seeing any things that i dont see. Nursing home claims that pt has been hallucinating and talking to himself.   The history is provided by the patient.    Past Medical History  Diagnosis Date  . History of skin cancer   . Sialolithiasis   . Osteoarthrosis, unspecified whether generalized or localized, shoulder region   . Bipolar disorder, unspecified (HCC)   . Unspecified essential hypertension   . Macular degeneration, age related, exudative (HCC)     revieving intra-ocular injections left eye   Past Surgical History  Procedure Laterality Date  . Hemorrhoid surgery  1968   Family History  Problem Relation Age of Onset  . Lymphoma Mother   . Colon cancer Sister   . COPD Sister   . Diabetes Neg Hx   . Hyperlipidemia Neg Hx   . Heart disease Neg Hx   . Cancer Sister     colon cancer   Social History  Substance Use Topics  . Smoking status: Never Smoker   . Smokeless tobacco: None  . Alcohol Use: 3.5 oz/week    7 drink(s) per week    Review of Systems  Constitutional: Negative for activity change and appetite change.  Respiratory: Negative for cough and shortness of breath.   Cardiovascular: Negative for chest pain.  Gastrointestinal: Negative for abdominal pain.  Genitourinary: Negative for  dysuria.  Psychiatric/Behavioral: Positive for confusion.      Allergies  Review of patient's allergies indicates no known allergies.  Home Medications   Prior to Admission medications   Medication Sig Start Date End Date Taking? Authorizing Provider  acetaminophen (TYLENOL) 325 MG tablet Take 2 tablets (650 mg total) by mouth every 4 (four) hours as needed. 07/19/13  Yes Emina Riebock, NP  amLODipine (NORVASC) 10 MG tablet Take 1 tablet (10 mg total) by mouth daily. Calcium channel blocker for BP 07/31/13  Yes Jacques NavyMichael E Norins, MD  Ascorbic Acid (VITAMIN C-ACEROLA) 500 MG CHEW Chew 2,000 mg by mouth daily.     Yes Historical Provider, MD  Cholecalciferol 1000 UNITS tablet Take 1,000 Units by mouth daily.     Yes Historical Provider, MD  co-enzyme Q-10 50 MG capsule Take 50 mg by mouth daily.   Yes Historical Provider, MD  [employer] Zinc 50 MG TABS Take 50 mg by mouth 2 (two) times daily.    Yes Historical Provider, MD  docusate sodium (COLACE) 100 MG capsule Take 1 capsule (100 mg total) by mouth 2 (two) times daily. 08/12/15  Yes Nonie HoyerMegan N Baird, PA-C  Folic Acid-Vit B6-Vit B12 2.2-25-0.5 MG TABS Take 1 tablet by mouth daily.     Yes Historical Provider, MD  furosemide (LASIX) 40 MG tablet Take 1 tablet (40 mg total) by mouth daily. A diuretic  for BP 07/31/13  Yes Jacques Navy, MD  Glucosamine 500 MG TABS Take 500 mg by mouth daily.    Yes Historical Provider, MD  HYDROcodone-acetaminophen (NORCO/VICODIN) 5-325 MG tablet Take 1 tablet by mouth 4 (four) times daily as needed for moderate pain.   Yes Historical Provider, MD  ibuprofen (ADVIL,MOTRIN) 200 MG tablet Take 200 mg by mouth 3 (three) times daily.   Yes Historical Provider, MD  ipratropium-albuterol (DUONEB) 0.5-2.5 (3) MG/3ML SOLN Take 3 mLs by nebulization every 6 (six) hours as needed. Patient taking differently: Take 3 mLs by nebulization every 6 (six) hours as needed (SOB).  08/12/15  Yes Nonie Hoyer, PA-C  lisinopril  (PRINIVIL,ZESTRIL) 5 MG tablet Take 5 mg by mouth daily.   Yes Historical Provider, MD  LORazepam (ATIVAN) 0.5 MG tablet Take 0.5 mg by mouth daily as needed for anxiety.   Yes Historical Provider, MD  Multiple Vitamins-Minerals (MULTIVITAMIN,TX-MINERALS) tablet Take 1 tablet by mouth daily.     Yes Historical Provider, MD  polyethylene glycol (MIRALAX / GLYCOLAX) packet Take 17 g by mouth daily as needed. 08/12/15  Yes Nonie Hoyer, PA-C  QUEtiapine (SEROQUEL) 25 MG tablet Take 12.5-25 mg by mouth at bedtime. 12.5MG  in AM and  in afternoon   Yes Historical Provider, MD  Selenium 100 MCG TABS Take 100 mcg by mouth daily.    Yes Historical Provider, MD  sertraline (ZOLOFT) 25 MG tablet Take 25 mg by mouth daily.   Yes Historical Provider, MD  vitamin A 09811 UNIT capsule Take 10,000 Units by mouth daily.    Yes Historical Provider, MD  naproxen (NAPROSYN) 500 MG tablet Take 1 tablet (500 mg total) by mouth 2 (two) times daily with a meal. Patient not taking: Reported on 10/15/2015 08/12/15   Nonie Hoyer, PA-C  traMADol (ULTRAM) 50 MG tablet Take 1-2 tablets (50-100 mg total) by mouth every 6 (six) hours as needed (  for mild pain,  for moderate pain,  for severe pain). THIS PRESCRIPTION WILL SENT WITH HIM TO THE ASSISTED LIVING UPON DISCHARGE FROM [facility] Patient not taking: Reported on 10/15/2015 09/06/15   Sharee Holster, NP   BP 117/65 mmHg  Pulse 74  Temp(Src) 98.3 F (36.8 C) (Oral)  Resp 18  SpO2 98% Physical Exam  Constitutional: He is oriented to person, place, and time. He appears well-developed.  HENT:  Head: Atraumatic.  Neck: Neck supple.  Cardiovascular: Normal rate.   Pulmonary/Chest: Effort normal.  Neurological: He is alert and oriented to person, place, and time.  Skin: Skin is warm.  Psychiatric: He has a normal mood and affect. His behavior is normal. Thought content normal.  Nursing note and vitals reviewed.   ED Course  Procedures  (including critical care time) Labs Review Labs Reviewed  COMPREHENSIVE METABOLIC PANEL - Abnormal; Notable for the following:    Glucose, Bld 152 (*)    BUN 38 (*)    Creatinine, Ser 1.29 (*)    GFR calc non Af Amer 49 (*)    GFR calc Af Amer 57 (*)    All other components within normal limits  ACETAMINOPHEN LEVEL - Abnormal; Notable for the following:    Acetaminophen (Tylenol), Serum <10 (*)    All other components within normal limits  CBC - Abnormal; Notable for the following:    Hemoglobin 11.3 (*)    HCT 34.8 (*)    RDW 18.4 (*)    All other components within normal limits  ETHANOL  SALICYLATE LEVEL  URINE RAPID DRUG SCREEN, HOSP PERFORMED  URINALYSIS, ROUTINE W REFLEX MICROSCOPIC (NOT AT [facility])    Imaging Review Dg Chest 2 View  10/15/2015  CLINICAL DATA:  Medical clearance. EXAM: CHEST  2 VIEW COMPARISON:  08/08/2015 as well as chest CT 08/08/2015 FINDINGS: Lungs are adequately inflated with subtle blunting of the right costophrenic angle likely pleural parenchymal scarring versus small amount residual right pleural fluid. Cardiomediastinal silhouette is within normal. Evidence of patient's right anterior fifth rib fracture. Mild degenerate change of the spine. IMPRESSION: No active cardiopulmonary disease. Minimal pleural parenchymal scarring versus possible small residual right pleural fluid. Known right anterior fifth rib fracture. Electronically Signed   By: Elberta Fortis M.D.   On: 10/15/2015 19:53   I have personally reviewed and evaluated these images and lab results as part of my medical decision-making.   EKG Interpretation None      MDM   Final diagnoses:  Delusional disorder (HCC)    Pt sent to the ER for the 2nd time for aggressive behavior this week. IVC paperwork completed by the [employer]. PT allegedly threatening to kill his wife because she is cheating on him with men at the nursing home. Nursing home thinks patient is delusional and responding to  internal stimuli.  Will consult TTS.  12:38 AM We spoke with son and the nursing facility  -and they all think pt is delusional. He is in the same room as his wife. Will get TTS involved.  Derwood Kaplan, MD 10/16/15 [phone]

## 2015-10-15 NOTE — ED Notes (Signed)
Pt is resting comfortably in bed watching tv.  Safety sitter is just outside the room with the door opened and pt clearly visible.  No acute distress noted and no needs expressed.

## 2015-10-15 NOTE — BH Assessment (Signed)
Per Julieanne CottonJosephine TTS to seek placement tonight. However, if patient remains in the ED overnight psychiatry  will be re-evaluated in the morning. Julieanne CottonJosephine, NP also request that a urinalysis is completed.

## 2015-10-15 NOTE — ED Notes (Signed)
Pt has jogging pants  And shirt, shoes, hat and coat all placed in personal belongings bag at nurses station at [facility]

## 2015-10-15 NOTE — Progress Notes (Signed)
CS[facility] to inquire if patient would be able to return to facility upon discharge.   CSW spoke with Melinda/ Health Care Coordinator. She states that the patient has become increasingly verbally aggressive, and delusional. Also, she states that the accusations that the patient has been making against his wife are absolutely false. Care Coordinator expressed concerns that that patient's mental health may be declining, and that he also gets up in the middle of the night and goes to his wife room making accusations.   Health Care Coordinator states that to ensure that patient is welcomed back upon discharge to call facility and speak with Priscilla/ RN Resident Care Coordinator.  Trish MageBrittney Lumi Winslett, LCSWA [phone] ED CSW 10/15/2015 7:30 PM
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
## 2015-10-15 NOTE — ED Notes (Addendum)
rn called and spoke with pts son Worthy RancherJim Helfrich [phone]) he states pts wife is not sleeping with other men, and that pt had a similar incident of this thinking 5 years ago, and that pt thinks the son and wife are "imposters".  [facility] [phone]), staff also stated that pts wife is not sleeping with other men, and that pt and wife stay in the same room.

## 2015-10-15 NOTE — BH Assessment (Signed)
[facility] due to medical acuity.

## 2015-10-15 NOTE — Progress Notes (Signed)
CSW faxed IVC paperwork to [employer] [place].  Trish MageBrittney Mariama Saintvil, LCSWA [phone] ED CSW 10/15/2015 10:22 PM
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
## 2015-10-16 DIAGNOSIS — F319 Bipolar disorder, unspecified: Secondary | ICD-10-CM | POA: Diagnosis present

## 2015-10-16 NOTE — ED Notes (Signed)
Pt is still sleeping and in no acute distress. Safety sitter in sight of room.

## 2015-10-16 NOTE — ED Notes (Addendum)
Pt is awake and alert with the safety sitter present at the bedside.  RN provided soda and fresh ice water per request.  When offered medication to help him sleep, the pt refused.  No additional needs at this time and no acute distress noted.

## 2015-10-16 NOTE — ED Notes (Signed)
Pt exhibits some confusion.  States year is [id number] and almost [id number].  Reoriented to 2016.  Pt  Agitated that he is still here.  Knows he is leaving today but believes he is going back to facility he came from.  Pt upset that RN attempting to establish orientation.  States he knows who he is and that I would be "upset if I were here all night".  Pt then spoke of facts: name, place from, age, etc.  Sherriff's office message left for transport.

## 2015-10-16 NOTE — ED Notes (Signed)
Pt resting and in no acute distress; safety sitter in sight.

## 2015-10-16 NOTE — BH Assessment (Signed)
Pt has been [facility] to [facility] to the services of Dr. Deeann DowseBen Palumbo. Nurse report can be called to [phone]. Informed Dr. Bebe ShaggyWickline of the disposition.

## 2015-10-16 NOTE — ED Notes (Signed)
Called report to Delorise ShinerGrace, Charity fundraiserN at [facility].

## 2015-10-16 NOTE — Progress Notes (Signed)
CS[facility] with [facility] to inform her that patient was sent to [facility]. CSW thanked their agency for assisting with obtaining patient a bed at [facility].   [name], LCSWA [phone] ED CSW 10/16/2015 4:28 PM

## 2015-10-16 NOTE — ED Notes (Addendum)
Pt continues to rest comfortably in bed with no signs of acute distress.  Safety sitter is just outside the room with the door open and pt in clear sight.

## 2015-10-16 NOTE — ED Notes (Signed)
Pt continues to rest comfortably with equal chest rise and fall noted.  No acute distress.

## 2015-10-16 NOTE — ED Provider Notes (Signed)
Pt awake/alert He is appropriate for transfer He has no complaints at this time BP 134/71 mmHg  Pulse 77  Temp(Src) 98.3 F (36.8 C) (Oral)  Resp 18  SpO2 95%   The patient appears reasonably stabilized for transfer considering the current resources, flow, and capabilities available in the ED at this time, and I doubt any other [facility] requiring further screening and/or treatment in the ED prior to transfer.   Zadie Rhineonald Jonnell Hentges, MD 10/16/15 [phone]

## 2016-01-12 ENCOUNTER — Encounter: Payer: Self-pay | Admitting: *Deleted

## 2016-01-12 ENCOUNTER — Emergency Department
Admission: EM | Admit: 2016-01-12 | Discharge: 2016-01-12 | Disposition: A | Discharge status: Home / Self Care | Payer: Medicare Other | Source: Home / Self Care | Attending: Family Medicine | Admitting: Family Medicine

## 2016-01-12 DIAGNOSIS — S0990XA Unspecified injury of head, initial encounter: Secondary | ICD-10-CM | POA: Diagnosis not present

## 2016-01-12 DIAGNOSIS — S0121XA Laceration without foreign body of nose, initial encounter: Secondary | ICD-10-CM

## 2016-01-12 DIAGNOSIS — S0181XA Laceration without foreign body of other part of head, initial encounter: Secondary | ICD-10-CM | POA: Diagnosis not present

## 2016-01-12 DIAGNOSIS — S80211A Abrasion, right knee, initial encounter: Secondary | ICD-10-CM

## 2016-01-12 LAB — POCT CBC W AUTO DIFF (K'VILLE URGENT CARE)

## 2016-01-12 MED ORDER — TETANUS-DIPHTH-ACELL PERTUSSIS 5-2.5-18.5 LF-MCG/0.5 IM SUSP
0.5000 mL | Freq: Once | INTRAMUSCULAR | Status: AC
  Administered 2016-01-12: 0.5 mL via INTRAMUSCULAR

## 2016-01-12 MED ORDER — CEPHALEXIN 500 MG PO CAPS: 500.0000 mg | ORAL_CAPSULE | Freq: Two times a day (BID) | ORAL | Status: DC

## 2016-01-12 NOTE — Discharge Instructions (Signed)
Leave today's bandage in place until follow-up visit tomorrow.  Apply ice pack for 10 to 15 minutes, every 2 to 3 hours to decreased swelling.   After tomorrow, cange dressing daily and apply Bacitracin ointment to wound.  Keep wound clean and dry.  Return for any signs of infection (or follow-up with family doctor):  Increasing redness, swelling, pain, heat, drainage, etc.  May take Tylenol as needed for pain. Return in one week for suture removal.   Monitor closely during the next 24 hours. Monitor blood pressure and record; follow-up with family doctor If symptoms become significantly worse during the night or over the weekend, proceed to the local emergency room.    Facial Laceration  A facial laceration is a cut on the face. These injuries can be painful and cause bleeding. Lacerations usually heal quickly, but they need special care to reduce scarring. DIAGNOSIS  Your health care provider will take a medical history, ask for details about how the injury occurred, and examine the wound to determine how deep the cut is. TREATMENT  Some facial lacerations may not require closure. Others may not be able to be closed because of an increased risk of infection. The risk of infection and the chance for successful closure will depend on various factors, including the amount of time since the injury occurred. The wound may be cleaned to help prevent infection. If closure is appropriate, pain medicines may be given if needed. Your health care provider will use stitches (sutures), wound glue (adhesive), or skin adhesive strips to repair the laceration. These tools bring the skin edges together to allow for faster healing and a better cosmetic outcome. If needed, you may also be given a tetanus shot. HOME CARE INSTRUCTIONS  Only take over-the-counter or prescription medicines as directed by your health care provider.  Follow your health care provider's instructions for wound care. These instructions will  vary depending on the technique used for closing the wound. For Sutures:  Keep the wound clean and dry.   If you were given a bandage (dressing), you should change it at least once a day. Also change the dressing if it becomes wet or dirty, or as directed by your health care provider.   Wash the wound with soap and water 2 times a day. Rinse the wound off with water to remove all soap. Pat the wound dry with a clean towel.   After cleaning, apply a thin layer of the antibiotic ointment recommended by your health care provider. This will help prevent infection and keep the dressing from sticking.   You may shower as usual after the first 24 hours. Do not soak the wound in water until the sutures are removed.   Get your sutures removed as directed by your health care provider. With facial lacerations, sutures should usually be taken out after 4-5 days to avoid stitch marks.   Wait a few days after your sutures are removed before applying any makeup. For Skin Adhesive Strips:  Keep the wound clean and dry.   Do not get the skin adhesive strips wet. You may bathe carefully, using caution to keep the wound dry.   If the wound gets wet, pat it dry with a clean towel.   Skin adhesive strips will fall off on their own. You may trim the strips as the wound heals. Do not remove skin adhesive strips that are still stuck to the wound. They will fall off in time.  For Wound Adhesive:  You  may briefly wet your wound in the shower or bath. Do not soak or scrub the wound. Do not swim. Avoid periods of heavy sweating until the skin adhesive has fallen off on its own. After showering or bathing, gently pat the wound dry with a clean towel.   Do not apply liquid medicine, cream medicine, ointment medicine, or makeup to your wound while the skin adhesive is in place. This may loosen the film before your wound is healed.   If a dressing is placed over the wound, be careful not to apply tape  directly over the skin adhesive. This may cause the adhesive to be pulled off before the wound is healed.   Avoid prolonged exposure to sunlight or tanning lamps while the skin adhesive is in place.  The skin adhesive will usually remain in place for 5-10 days, then naturally fall off the skin. Do not pick at the adhesive film.  After Healing: Once the wound has healed, cover the wound with sunscreen during the day for 1 full year. This can help minimize scarring. Exposure to ultraviolet [place] in the first year will darken the scar. It can take 1-2 years for the scar to lose its redness and to heal completely.  SEEK MEDICAL CARE IF:  You have a fever. SEEK IMMEDIATE MEDICAL CARE IF:  You have redness, pain, or swelling around the wound.   You see ayellowish-white fluid (pus) coming from the wound.    This information is not intended to replace advice given to you by your health care provider. Make sure you discuss any questions you have with your health care provider.   Document Released: 11/10/2004 Document Revised: 10/24/2014 Document Reviewed: 05/16/2013 Elsevier Interactive Patient Education 2016 Else[employer] Inc[employer]   Head Injury, Adult You have a head injury. Headaches and throwing up (vomiting) are common after a head injury. It should be easy to wake up from sleeping. Sometimes you must stay in the hospital. Most problems happen within the first 24 hours. Side effects may occur up to 7-10 days after the injury.  WHAT ARE THE TYPES OF HEAD INJURIES? Head injuries can be as minor as a bump. Some head injuries can be more severe. More severe head injuries include:  A jarring injury to the brain (concussion).  A bruise of the brain (contusion). This mean there is bleeding in the brain that can cause swelling.  A cracked skull (skull fracture).  Bleeding in the brain that collects, clots, and forms a bump (hematoma). WHEN SHOULD I GET HELP RIGHT AWAY?   You are confused or  sleepy.  You cannot be woken up.  You feel sick to your stomach (nauseous) or keep throwing up (vomiting).  Your dizziness or unsteadiness is getting worse.  You have very bad, lasting headaches that are not helped by medicine. Take medicines only as told by your doctor.  You cannot use your arms or legs like normal.  You cannot walk.  You notice changes in the black spots in the center of the colored part of your eye (pupil).  You have clear or bloody fluid coming from your nose or ears.  You have trouble seeing. During the next 24 hours after the injury, you must stay with someone who can watch you. This person should get help right away (call 911 in the [place] if you start to shake and are not able to control it (have seizures), you pass out, or you are unable to wake up. HOW CAN I PREVENT  A HEAD INJURY IN THE FUTURE?  Wear seat belts.  Wear a helmet while bike riding and playing sports like football.  Stay away from dangerous activities around the house. WHEN CAN I RETURN TO NORMAL ACTIVITIES AND ATHLETICS? See your doctor before doing these activities. You should not do normal activities or play contact sports until 1 week after the following symptoms have stopped:  Headache that does not go away.  Dizziness.  Poor attention.  Confusion.  Memory problems.  Sickness to your stomach or throwing up.  Tiredness.  Fussiness.  Bothered by bright lights or loud noises.  Anxiousness or depression.  Restless sleep. MAKE SURE YOU:   Understand these instructions.  Will watch your condition.  Will get help right away if you are not doing well or get worse.   This information is not intended to replace advice given to you by your health care provider. Make sure you discuss any questions you have with your health care provider.   Document Released: 09/15/2008 Document Revised: 10/24/2014 Document Reviewed: 06/10/2013 Elsevier Interactive Patient Education [id number]  [employer]vier Inc[employer]

## 2016-01-12 NOTE — ED Provider Notes (Signed)
CSN: [id number]     Arrival date & time 01/12/16  3086 History   First [name] Initiated Contact with Patient 01/12/16 [phone]     Chief Complaint  Patient presents with  . Head Laceration      HPI Comments: Patient accompanied by his son who reports that he heard his father fall out of bed this morning.  The son went immediately into father's bedroom and found his father conscious on floor with laceration to forehead and abrasion right knee.  Patient reports that he felt [name] while getting out of bed.  EMS was summoned to evaluate patient. Son reports that his father has been acting normally since his fall.  No history of recurring falls.  No alcohol use.  No recent change in medications.  No blood thinners (including aspirin). Patient denies headache and neck pain.  He reports an abrasion to his right knee.  Patient is a 82 y.o. male presenting with head injury. The history is provided by the patient and a relative.  Head Injury Location:  Frontal Time since incident:  1 hour Mechanism of injury: fall   Pain details:    Quality:  Aching   Severity:  Mild   Duration:  1 hour   Timing:  Constant   Progression:  Improving Chronicity:  New Relieved by:  Pressure Worsened by:  Nothing tried Ineffective treatments:  None tried Associated symptoms: no blurred vision, no difficulty breathing, no disorientation, no double vision, no focal weakness, no headaches, no hearing loss, no loss of consciousness, no memory loss, no nausea, no neck pain, no numbness, no seizures, no tinnitus and no vomiting   Risk factors: being elderly   Risk factors: no alcohol use and no aspirin use     Past Medical History  Diagnosis Date  . History of skin cancer   . Sialolithiasis   . Osteoarthrosis, unspecified whether generalized or localized, shoulder region   . Bipolar disorder, unspecified ([name])   . Unspecified essential hypertension   . Macular degeneration, age related, exudative ([name])    revieving intra-ocular injections left eye   Past Surgical History  Procedure Laterality Date  . Hemorrhoid surgery  1968   Family History  Problem Relation Age of Onset  . Lymphoma Mother   . Colon cancer Sister   . COPD Sister   . Diabetes Neg Hx   . Hyperlipidemia Neg Hx   . Heart disease Neg Hx   . Cancer Sister     colon cancer   Social History  Substance Use Topics  . Smoking status: Never Smoker   . Smokeless tobacco: None  . Alcohol Use: 3.5 oz/week    7 drink(s) per week    Review of Systems  Constitutional: Negative.   HENT: Positive for facial swelling. Negative for hearing loss, nosebleeds and tinnitus.   Eyes: Negative.  Negative for blurred vision and double vision.  Respiratory: Negative.   Cardiovascular: Negative.   Gastrointestinal: Negative.  Negative for nausea and vomiting.  Genitourinary: Negative.   Musculoskeletal: Negative for neck pain.  Skin: Positive for wound.  Neurological: Negative for focal weakness, seizures, loss of consciousness, syncope, speech difficulty, weakness, numbness and headaches.  Psychiatric/Behavioral: Negative for memory loss.    Allergies  Review of patient's allergies indicates no known allergies.  Home Medications   Prior to Admission medications   Medication Sig Start Date End Date Taking? Authorizing Provider  acetaminophen (TYLENOL) 325 MG tablet Take 2 tablets (650 mg total) by mouth every  4 (four) hours as needed. 07/19/13  Yes Nicholas Riebock, [name]  amLODipine (NORVASC) 10 MG tablet Take 1 tablet (10 mg total) by mouth daily. Calcium channel blocker for BP 07/31/13  Yes Nicholas Navy, [name]  divalproex (DEPAKOTE ER) 250 MG 24 hr tablet Take 25 mg by mouth 2 (two) times daily.   Yes Historical Provider, [name]  Melatonin 5 MG CAPS Take by mouth.   Yes Historical Provider, [name]  QUEtiapine (SEROQUEL) 25 MG tablet Take 12.5-25 mg by mouth at bedtime. 12.5MG  in AM and 25MG  in afternoon   Yes Historical Provider, [name]    sertraline (ZOLOFT) 25 MG tablet Take 25 mg by mouth daily.   Yes Historical Provider, [name]  Ascorbic Acid (VITAMIN C-ACEROLA) 500 MG CHEW Chew 2,000 mg by mouth daily.      Historical Provider, [name]  cephALEXin (KEFLEX) 500 MG capsule Take 1 capsule (500 mg total) by mouth 2 (two) times daily. 01/12/16   Nicholas Haw, [name]  Cholecalciferol 1000 UNITS tablet Take 1,000 Units by mouth daily.      Historical Provider, [name]  co-enzyme Q-10 50 MG capsule Take 50 mg by mouth daily.    Historical Provider, [name]  [employer] Zinc 50 MG TABS Take 50 mg by mouth 2 (two) times daily.     Historical Provider, [name]  docusate sodium (COLACE) 100 MG capsule Take 1 capsule (100 mg total) by mouth 2 (two) times daily. 08/12/15   Nicholas Hoyer, [name]  Folic Acid-Vit B6-Vit B12 2.2-25-0.5 MG TABS Take 1 tablet by mouth daily.      Historical Provider, [name]  Glucosamine 500 MG TABS Take 500 mg by mouth daily.     Historical Provider, [name]  HYDROcodone-acetaminophen (NORCO/VICODIN) 5-325 MG tablet Take 1 tablet by mouth 4 (four) times daily as needed for moderate pain.    Historical Provider, [name]  ibuprofen (ADVIL,MOTRIN) 200 MG tablet Take 200 mg by mouth 3 (three) times daily.    Historical Provider, [name]  ipratropium-albuterol (DUONEB) 0.5-2.5 (3) MG/3ML SOLN Take 3 mLs by nebulization every 6 (six) hours as needed. Patient taking differently: Take 3 mLs by nebulization every 6 (six) hours as needed (SOB).  08/12/15   Nicholas Hoyer, [name]  LORazepam (ATIVAN) 0.5 MG tablet Take 0.5 mg by mouth daily as needed for anxiety.    Historical Provider, [name]  Multiple Vitamins-Minerals (MULTIVITAMIN,TX-MINERALS) tablet Take 1 tablet by mouth daily.      Historical Provider, [name]  polyethylene glycol (MIRALAX / GLYCOLAX) packet Take 17 g by mouth daily as needed. 08/12/15   Nicholas Hoyer, [name]  Selenium 100 MCG TABS Take 100 mcg by mouth daily.     Historical Provider, [name]  vitamin A 16109 UNIT capsule Take 10,000 Units by mouth daily.     Historical  Provider, [name]   Meds Ordered and Administered this Visit   Medications  Tdap (BOOSTRIX) injection 0.5 mL (0.5 mLs Intramuscular Given 01/12/16 0853)    BP 167/94 mmHg  Pulse 102  Temp(Src) 98.4 F (36.9 C) (Oral)  Resp 18  SpO2 95% Orthostatic VS for the past 24 hrs:  BP- Lying Pulse- Lying BP- Sitting Pulse- Sitting BP- Standing at 0 minutes Pulse- Standing at 0 minutes  01/12/16 0952 156/84 mmHg 92 137/83 mmHg 94 116/64 mmHg 96    Physical Exam  Constitutional: He is oriented to person, place, and time. He appears well-developed and well-nourished. No distress.  HENT:  Head: Head is with laceration. Head is without raccoon's  eyes and without Battle's sign.    Right Ear: Tympanic membrane, external ear and ear canal normal.  Left Ear: Tympanic membrane, external ear and ear canal normal.  Mouth/Throat: Oropharynx is clear and moist.  Central forehead has a 7cm laceration as noted on diagram.  Minimal surrounding tenderness to palpation.  No hematoma; no bony step-offs.   Right nares has a small superficial 3mm long simple laceration as noted on diagram.       Eyes: Conjunctivae and EOM are normal. Pupils are equal, round, and reactive to [place].  Neck: Normal range of motion. Neck supple.  Cardiovascular: Normal heart sounds.   Pulmonary/Chest: Breath sounds normal. No respiratory distress.  Abdominal: Bowel sounds are normal. There is no tenderness.  Musculoskeletal: He exhibits no edema or tenderness.       Legs: 1.5cm abrasion right knee  Lymphadenopathy:    He has no cervical adenopathy.  Neurological: He is alert and oriented to person, place, and time. He has normal reflexes. He is not disoriented. He displays normal reflexes. No cranial nerve deficit or sensory deficit. He exhibits normal muscle tone. Coordination normal.  Reflex Scores:      Bicep reflexes are 2+ on the right side and 2+ on the left side.      Patellar reflexes are 2+ on the right side and 2+ on  the left side.      Achilles reflexes are 2+ on the right side and 2+ on the left side. Neurologic:  Cranial nerves 2 through 12 are normal.  Patellar, achilles, and elbow reflexes are normal.  Cerebellar function is intact.  Grip strength symmetric bilaterally.     Skin: Skin is warm and dry.  Psychiatric: He has a normal mood and affect. His behavior is normal.  Nursing note and vitals reviewed.   ED Course  Procedures Laceration Repair Discussed benefits and risks of procedure and verbal consent obtained. Using sterile technique and local anesthesia with 1% lidocaine with epinephrine, cleansed wound with Betadine followed by copious lavage with normal saline.  Wound carefully inspected for debris and foreign bodies; none found.  De-vitalized tissue lightly debrided.  Wound closed with #18, 5-0 interrupted nylon sutures.  Bacitracin and non-stick sterile dressing applied, followed by compression dressing.  Wound precautions explained to patient and son.  Return for follow-up tomorrow, and suture removal in one week.    Small minimal laceration right nares closed with Dermabond.  Abrasion right knee dressed with Bacitracin and bandage.    Labs Reviewed  POCT CBC W AUTO DIFF ([facility]):  WBC 8.3; LY 15.0; MO 2.4; GR 82.6; Hgb 11.0; Platelets 247      MDM   1. Laceration of forehead without complication, initial encounter   2. Laceration of nose without complication, initial encounter   3. Head injury, initial encounter   4. Abrasion of right knee, initial encounter    Records reviewed through Care Everywhere:  Patient has history of anemia (Hgb 10.3 on 10/23/2015) which is stable (Hgb 11.0 today) No indication for head imaging at this time. Administered Tdap. Begin empiric Keflex  BID Leave today's bandage in place until follow-up visit tomorrow.  Apply ice pack for 10 to 15 minutes, every 2 to 3 hours to decrease swelling.   After return visit tomorrow, change dressing  daily and apply Bacitracin ointment to wound.  Keep wound clean and dry.  Return for any signs of infection (or follow-up with family doctor):  Increasing redness, swelling, pain, heat,  drainage, etc.  May take Tylenol as needed for pain. Return in one week for suture removal.   Monitor closely during the next 24 hours. Monitor blood pressure and record; follow-up with family doctor. Head injury precautions discussed. If symptoms become significantly worse during the night or over the weekend, proceed to the local emergency room.     Nicholas HawStephen A Beese, [name] 01/12/16 1426

## 2016-01-12 NOTE — ED Notes (Addendum)
Pt c/o 2 lacerations to the RT side of his forehead and to the RT side of his nose x this morning after falling out of bed. Tdap unknown. Denies LOC.

## 2016-01-14 ENCOUNTER — Telehealth: Payer: Self-pay | Admitting: *Deleted

## 2016-01-19 ENCOUNTER — Encounter: Payer: Self-pay | Admitting: *Deleted

## 2016-01-19 ENCOUNTER — Emergency Department (INDEPENDENT_AMBULATORY_CARE_PROVIDER_SITE_OTHER)
Admission: EM | Admit: 2016-01-19 | Discharge: 2016-01-19 | Disposition: A | Discharge status: Home / Self Care | Payer: Medicare Other | Source: Home / Self Care | Attending: Family Medicine | Admitting: Family Medicine

## 2016-01-19 DIAGNOSIS — Z4802 Encounter for removal of sutures: Secondary | ICD-10-CM

## 2016-01-19 NOTE — Discharge Instructions (Signed)

## 2016-01-19 NOTE — ED Provider Notes (Signed)
CSN: [id number]     Arrival date & time 01/19/16  1044 History   First MD Initiated Contact with Patient 01/19/16 1048     Chief Complaint  Patient presents with  . Suture / Staple Removal   (Consider location/radiation/quality/duration/timing/severity/associated sxs/prior Treatment) HPI  The pt is an 82yo male brought to [facility] by his son for removal of [facility].  Pt fell out of bed, resulting in a large laceration to forehead.  Pt completed course of Keflex. No concerns today. Denies fever, pain, or draining of pus. Denies n/v/d. Pt has been eating and drinking well, acting normally per pt's son, who pt is staying with at this time.   Past Medical History  Diagnosis Date  . History of skin cancer   . Sialolithiasis   . Osteoarthrosis, unspecified whether generalized or localized, shoulder region   . Bipolar disorder, unspecified (HCC)   . Unspecified essential hypertension   . Macular degeneration, age related, exudative (HCC)     revieving intra-ocular injections left eye   Past Surgical History  Procedure Laterality Date  . Hemorrhoid surgery  1968   Family History  Problem Relation Age of Onset  . Lymphoma Mother   . Colon cancer Sister   . COPD Sister   . Diabetes Neg Hx   . Hyperlipidemia Neg Hx   . Heart disease Neg Hx   . Cancer Sister     colon cancer   Social History  Substance Use Topics  . Smoking status: Never Smoker   . Smokeless tobacco: None  . Alcohol Use: 3.5 oz/week    7 drink(s) per week    Review of Systems  Constitutional: Negative for fever and chills.  Gastrointestinal: Negative for nausea and vomiting.  Musculoskeletal: Negative for myalgias and arthralgias.  Skin: Positive for wound. Negative for color change.  Neurological: Negative for dizziness, syncope and headaches.    Allergies  Review of patient's allergies indicates no known allergies.  Home Medications   Prior to Admission medications     Medication Sig Start Date End Date Taking? Authorizing Provider  acetaminophen (TYLENOL) 325 MG tablet Take 2 tablets (650 mg total) by mouth every 4 (four) hours as needed. 07/19/13   Emina Riebock, NP  amLODipine (NORVASC) 10 MG tablet Take 1 tablet (10 mg total) by mouth daily. Calcium channel blocker for BP 07/31/13   Jacques NavyMichael E Norins, MD  Ascorbic Acid (VITAMIN C-ACEROLA) 500 MG CHEW Chew 2,000 mg by mouth daily.      Historical Provider, MD  cephALEXin (KEFLEX) 500 MG capsule Take 1 capsule (500 mg total) by mouth 2 (two) times daily. 01/12/16   Lattie HawStephen A Beese, MD  Cholecalciferol 1000 UNITS tablet Take 1,000 Units by mouth daily.      Historical Provider, MD  co-enzyme Q-10 50 MG capsule Take 50 mg by mouth daily.    Historical Provider, MD  [employer] Zinc 50 MG TABS Take 50 mg by mouth 2 (two) times daily.     Historical Provider, MD  divalproex (DEPAKOTE ER) 250 MG 24 hr tablet Take 25 mg by mouth 2 (two) times daily.    Historical Provider, MD  docusate sodium (COLACE) 100 MG capsule Take 1 capsule (100 mg total) by mouth 2 (two) times daily. 08/12/15   Nonie HoyerMegan N Baird, PA-C  Folic Acid-Vit B6-Vit B12 2.2-25-0.5 MG TABS Take 1 tablet by mouth daily.      Historical Provider, MD  Glucosamine 500 MG TABS  Take 500 mg by mouth daily.     Historical Provider, MD  HYDROcodone-acetaminophen (NORCO/VICODIN) 5-325 MG tablet Take 1 tablet by mouth 4 (four) times daily as needed for moderate pain.    Historical Provider, MD  ibuprofen (ADVIL,MOTRIN) 200 MG tablet Take 200 mg by mouth 3 (three) times daily.    Historical Provider, MD  ipratropium-albuterol (DUONEB) 0.5-2.5 (3) MG/3ML SOLN Take 3 mLs by nebulization every 6 (six) hours as needed. Patient taking differently: Take 3 mLs by nebulization every 6 (six) hours as needed (SOB).  08/12/15   Nonie Hoyer, PA-C  LORazepam (ATIVAN) 0.5 MG tablet Take 0.5 mg by mouth daily as needed for anxiety.    Historical Provider, MD  Melatonin 5 MG CAPS Take by  mouth.    Historical Provider, MD  Multiple Vitamins-Minerals (MULTIVITAMIN,TX-MINERALS) tablet Take 1 tablet by mouth daily.      Historical Provider, MD  polyethylene glycol (MIRALAX / GLYCOLAX) packet Take 17 g by mouth daily as needed. 08/12/15   Nonie Hoyer, PA-C  QUEtiapine (SEROQUEL) 25 MG tablet Take 12.5-25 mg by mouth at bedtime. 12.5MG  in AM and  in afternoon    Historical Provider, MD  Selenium 100 MCG TABS Take 100 mcg by mouth daily.     Historical Provider, MD  sertraline (ZOLOFT) 25 MG tablet Take 25 mg by mouth daily.    Historical Provider, MD  vitamin A 16109 UNIT capsule Take 10,000 Units by mouth daily.     Historical Provider, MD   Meds Ordered and Administered this Visit  Medications - No data to display  BP 109/74 mmHg  Pulse 82  Temp(Src) 98.4 F (36.9 C) (Oral)  Resp 16  SpO2 98% No data found.   Physical Exam  Constitutional: He is oriented to person, place, and time. He appears well-developed and well-nourished.  HENT:  Head: Normocephalic. Head is with laceration.    Well healed laceration on forehead. Sutures in place. Mild scabbing around and over sutures.   Eyes: EOM are normal.  Neck: Normal range of motion.  Cardiovascular: Normal rate.   Pulmonary/Chest: Effort normal.  Musculoskeletal: Normal range of motion.  Neurological: He is alert and oriented to person, place, and time.  Skin: Skin is warm and dry.  Psychiatric: He has a normal mood and affect. His behavior is normal.  Nursing note and vitals reviewed.   ED Course  .Suture Removal Date/Time: 01/19/2016 11:35 AM Performed by: Junius Finner Authorized by: Donna Christen A Consent: Verbal consent obtained. Risks and benefits: risks, benefits and alternatives were discussed Consent given by: patient Patient understanding: patient states understanding of the procedure being performed Patient consent: the patient's understanding of the procedure matches consent given Required  items: required blood products, implants, devices, and special equipment available Patient identity confirmed: verbally with patient Time out: Immediately prior to procedure a "time out" was called to verify the correct patient, procedure, equipment, support staff and site/side marked as required. Body area: head/neck Location details: forehead Wound Appearance: pink (mildly scabbed over in areas) Sutures Removed: 19 Post-removal: antibiotic ointment applied Facility: sutures placed in this facility Patient tolerance: Patient tolerated the procedure well with no immediate complications   (including critical care time)  Labs Review Labs Reviewed - No data to display  Imaging Review No results found.    MDM   1. Visit for suture removal    Pt presenting for suture removal. Well healed laceration. Sutures removed w/o immediate complications. No evidence of underlying  infection. Home care instructions provided.  F/u with PCP as needed. Pt and son verbalized understanding and agreement with tx plan.     Junius Finner, PA-C 01/19/16 1138

## 2016-01-19 NOTE — ED Notes (Signed)
Fayrene FearingJames is here today for suture removal from his face placed on 01/12/16.

## 2017-04-23 ENCOUNTER — Emergency Department (HOSPITAL_COMMUNITY): Payer: Medicare Other

## 2017-04-23 ENCOUNTER — Inpatient Hospital Stay (HOSPITAL_COMMUNITY): Payer: Medicare Other

## 2017-04-23 ENCOUNTER — Encounter (HOSPITAL_COMMUNITY): Payer: Self-pay

## 2017-04-23 ENCOUNTER — Inpatient Hospital Stay (HOSPITAL_COMMUNITY)
Admission: EM | Admit: 2017-04-23 | Discharge: 2017-04-28 | DRG: 480 | Disposition: A | Discharge status: Skilled Nursing Facility | Payer: Medicare Other | Attending: Internal Medicine | Admitting: Internal Medicine

## 2017-04-23 DIAGNOSIS — S72009A Fracture of unspecified part of neck of unspecified femur, initial encounter for closed fracture: Secondary | ICD-10-CM | POA: Insufficient documentation

## 2017-04-23 DIAGNOSIS — R0902 Hypoxemia: Secondary | ICD-10-CM | POA: Diagnosis present

## 2017-04-23 DIAGNOSIS — D638 Anemia in other chronic diseases classified elsewhere: Secondary | ICD-10-CM | POA: Diagnosis present

## 2017-04-23 DIAGNOSIS — Z419 Encounter for procedure for purposes other than remedying health state, unspecified: Secondary | ICD-10-CM

## 2017-04-23 DIAGNOSIS — Z66 Do not resuscitate: Secondary | ICD-10-CM | POA: Diagnosis present

## 2017-04-23 DIAGNOSIS — Z807 Family history of other malignant neoplasms of lymphoid, hematopoietic and related tissues: Secondary | ICD-10-CM | POA: Diagnosis not present

## 2017-04-23 DIAGNOSIS — W1830XA Fall on same level, unspecified, initial encounter: Secondary | ICD-10-CM | POA: Diagnosis present

## 2017-04-23 DIAGNOSIS — D539 Nutritional anemia, unspecified: Secondary | ICD-10-CM | POA: Diagnosis present

## 2017-04-23 DIAGNOSIS — E876 Hypokalemia: Secondary | ICD-10-CM | POA: Diagnosis present

## 2017-04-23 DIAGNOSIS — Z85828 Personal history of other malignant neoplasm of skin: Secondary | ICD-10-CM | POA: Diagnosis not present

## 2017-04-23 DIAGNOSIS — M25551 Pain in right hip: Secondary | ICD-10-CM | POA: Diagnosis present

## 2017-04-23 DIAGNOSIS — F3164 Bipolar disorder, current episode mixed, severe, with psychotic features: Secondary | ICD-10-CM | POA: Diagnosis not present

## 2017-04-23 DIAGNOSIS — K5900 Constipation, unspecified: Secondary | ICD-10-CM | POA: Diagnosis not present

## 2017-04-23 DIAGNOSIS — D62 Acute posthemorrhagic anemia: Secondary | ICD-10-CM | POA: Diagnosis not present

## 2017-04-23 DIAGNOSIS — Z825 Family history of asthma and other chronic lower respiratory diseases: Secondary | ICD-10-CM | POA: Diagnosis not present

## 2017-04-23 DIAGNOSIS — E43 Unspecified severe protein-calorie malnutrition: Secondary | ICD-10-CM | POA: Diagnosis present

## 2017-04-23 DIAGNOSIS — D696 Thrombocytopenia, unspecified: Secondary | ICD-10-CM | POA: Diagnosis present

## 2017-04-23 DIAGNOSIS — G9340 Encephalopathy, unspecified: Secondary | ICD-10-CM

## 2017-04-23 DIAGNOSIS — S72141A Displaced intertrochanteric fracture of right femur, initial encounter for closed fracture: Secondary | ICD-10-CM | POA: Diagnosis present

## 2017-04-23 DIAGNOSIS — F319 Bipolar disorder, unspecified: Secondary | ICD-10-CM | POA: Diagnosis present

## 2017-04-23 DIAGNOSIS — H548 Legal blindness, as defined in USA: Secondary | ICD-10-CM | POA: Diagnosis present

## 2017-04-23 DIAGNOSIS — R296 Repeated falls: Secondary | ICD-10-CM | POA: Diagnosis present

## 2017-04-23 DIAGNOSIS — F0390 Unspecified dementia without behavioral disturbance: Secondary | ICD-10-CM | POA: Diagnosis present

## 2017-04-23 DIAGNOSIS — H353290 Exudative age-related macular degeneration, unspecified eye, stage unspecified: Secondary | ICD-10-CM | POA: Diagnosis present

## 2017-04-23 DIAGNOSIS — S72001A Fracture of unspecified part of neck of right femur, initial encounter for closed fracture: Secondary | ICD-10-CM

## 2017-04-23 DIAGNOSIS — R1314 Dysphagia, pharyngoesophageal phase: Secondary | ICD-10-CM | POA: Diagnosis present

## 2017-04-23 DIAGNOSIS — I10 Essential (primary) hypertension: Secondary | ICD-10-CM | POA: Diagnosis present

## 2017-04-23 DIAGNOSIS — M1990 Unspecified osteoarthritis, unspecified site: Secondary | ICD-10-CM | POA: Diagnosis present

## 2017-04-23 DIAGNOSIS — W19XXXA Unspecified fall, initial encounter: Secondary | ICD-10-CM

## 2017-04-23 DIAGNOSIS — D72829 Elevated white blood cell count, unspecified: Secondary | ICD-10-CM | POA: Diagnosis present

## 2017-04-23 DIAGNOSIS — Z800 Family history of malignant neoplasm of digestive organs: Secondary | ICD-10-CM

## 2017-04-23 LAB — CBC WITH DIFFERENTIAL/PLATELET
Basophils Absolute: 0 10*3/uL (ref 0.0–0.1)
Basophils Relative: 0 %
Eosinophils Absolute: 0 10*3/uL (ref 0.0–0.7)
Eosinophils Relative: 0 %
HCT: 28.6 % — ABNORMAL LOW (ref 39.0–52.0)
Hemoglobin: 9.9 g/dL — ABNORMAL LOW (ref 13.0–17.0)
Lymphocytes Relative: 4 %
Lymphs Abs: 0.5 10*3/uL — ABNORMAL LOW (ref 0.7–4.0)
MCH: 29.8 pg (ref 26.0–34.0)
MCHC: 34.6 g/dL (ref 30.0–36.0)
MCV: 86.1 fL (ref 78.0–100.0)
Monocytes Absolute: 0.5 10*3/uL (ref 0.1–1.0)
Monocytes Relative: 3 %
Neutro Abs: 12.9 10*3/uL — ABNORMAL HIGH (ref 1.7–7.7)
Neutrophils Relative %: 93 %
Platelets: 227 10*3/uL (ref 150–400)
RBC: 3.32 MIL/uL — ABNORMAL LOW (ref 4.22–5.81)
RDW: 14.6 % (ref 11.5–15.5)
WBC: 13.9 10*3/uL — ABNORMAL HIGH (ref 4.0–10.5)

## 2017-04-23 LAB — BASIC METABOLIC PANEL
Anion gap: 8 (ref 5–15)
BUN: 26 mg/dL — ABNORMAL HIGH (ref 6–20)
CO2: 24 mmol/L (ref 22–32)
Calcium: 8.9 mg/dL (ref 8.9–10.3)
Chloride: 107 mmol/L (ref 101–111)
Creatinine, Ser: 0.7 mg/dL (ref 0.61–1.24)
GFR calc Af Amer: >60 mL/min (ref >60)
GFR calc non Af Amer: >60 mL/min (ref >60)
Glucose, Bld: 152 mg/dL — ABNORMAL HIGH (ref 65–99)
Potassium: 3.4 mmol/L — ABNORMAL LOW (ref 3.5–5.1)
Sodium: 139 mmol/L (ref 135–145)

## 2017-04-23 MED ORDER — AMLODIPINE BESYLATE 5 MG PO TABS
5.0000 mg | ORAL_TABLET | Freq: Every day | ORAL | Status: DC
  Administered 2017-04-23 – 2017-04-25 (×3): 5 mg via ORAL
  Filled 2017-04-23 (×4): qty 1

## 2017-04-23 MED ORDER — SERTRALINE HCL 25 MG PO TABS
25.0000 mg | ORAL_TABLET | Freq: Every day | ORAL | Status: DC
  Administered 2017-04-25 – 2017-04-27 (×3): 25 mg via ORAL
  Filled 2017-04-23 (×3): qty 1

## 2017-04-23 MED ORDER — MORPHINE SULFATE (PF) 2 MG/ML IV SOLN
4.0000 mg | INTRAVENOUS | Status: DC | PRN
  Administered 2017-04-23: 4 mg via INTRAVENOUS
  Filled 2017-04-23: qty 2

## 2017-04-23 MED ORDER — LORAZEPAM 2 MG/ML IJ SOLN
0.5000 mg | Freq: Once | INTRAMUSCULAR | Status: AC
  Administered 2017-04-23: 0.5 mg via INTRAVENOUS
  Filled 2017-04-23: qty 1

## 2017-04-23 MED ORDER — SODIUM CHLORIDE 0.9 % IV SOLN
INTRAVENOUS | Status: DC
  Administered 2017-04-23: 75 mL/h via INTRAVENOUS
  Administered 2017-04-24 – 2017-04-26 (×2): via INTRAVENOUS

## 2017-04-23 MED ORDER — MORPHINE SULFATE (PF) 2 MG/ML IV SOLN
1.0000 mg | INTRAVENOUS | Status: DC | PRN
  Administered 2017-04-23: 2 mg via INTRAVENOUS
  Filled 2017-04-23: qty 1

## 2017-04-23 MED ORDER — METHOCARBAMOL 500 MG PO TABS: 500.0000 mg | ORAL_TABLET | Freq: Four times a day (QID) | ORAL | Status: DC | PRN

## 2017-04-23 MED ORDER — ENOXAPARIN SODIUM 40 MG/0.4ML ~~LOC~~ SOLN
40.0000 mg | SUBCUTANEOUS | Status: DC
  Administered 2017-04-23: 40 mg via SUBCUTANEOUS
  Filled 2017-04-23: qty 0.4

## 2017-04-23 MED ORDER — METHOCARBAMOL 1000 MG/10ML IJ SOLN
500.0000 mg | Freq: Four times a day (QID) | INTRAVENOUS | Status: DC | PRN
  Filled 2017-04-23: qty 5

## 2017-04-23 MED ORDER — ORAL CARE MOUTH RINSE
15.0000 mL | Freq: Two times a day (BID) | OROMUCOSAL | Status: DC
  Administered 2017-04-24 – 2017-04-28 (×8): 15 mL via OROMUCOSAL

## 2017-04-23 MED ORDER — SODIUM CHLORIDE 0.9 % IV SOLN
INTRAVENOUS | Status: DC
Start: 2017-04-23 — End: 2017-04-23
  Administered 2017-04-23: 13:00:00 via INTRAVENOUS

## 2017-04-23 MED ORDER — QUETIAPINE FUMARATE 25 MG PO TABS: 25.0000 mg | ORAL_TABLET | Freq: Every day | ORAL | Status: DC

## 2017-04-23 MED ORDER — HYDROCODONE-ACETAMINOPHEN 5-325 MG PO TABS
1.0000 | ORAL_TABLET | Freq: Four times a day (QID) | ORAL | Status: DC | PRN
  Administered 2017-04-24: 1 via ORAL
  Filled 2017-04-23: qty 1

## 2017-04-23 NOTE — ED Notes (Signed)
Patient transported to x-ray. ?

## 2017-04-23 NOTE — ED Notes (Signed)
Bed: WA02 Expected date:  Expected time:  Means of arrival:  Comments: 83 yo fall; hip pain
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
## 2017-04-23 NOTE — Progress Notes (Signed)
Hospitalists notified of patients blood pressure 120/56. Nursing will continue to monitor.
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
## 2017-04-23 NOTE — ED Triage Notes (Signed)
Patient BIB EMS from home s/p witnessed fall. Patient wife stated he was trying to dress himself, and fell. Per EMS report, patient was unable to bear weight to right hip, deformity noted. Patient has history of HTN and has not taken his antihypertensives today. Patient also has history of Alzheimers and is at baseline, per report. Patient denies back pain/neck pain/LOC.

## 2017-04-24 ENCOUNTER — Encounter (HOSPITAL_COMMUNITY): Payer: Self-pay | Admitting: Urology

## 2017-04-24 ENCOUNTER — Encounter (HOSPITAL_COMMUNITY)
Admission: EM | Disposition: A | Discharge status: Skilled Nursing Facility | Payer: Self-pay | Source: Home / Self Care | Attending: Internal Medicine

## 2017-04-24 ENCOUNTER — Other Ambulatory Visit (INDEPENDENT_AMBULATORY_CARE_PROVIDER_SITE_OTHER): Payer: Self-pay | Admitting: Family

## 2017-04-24 ENCOUNTER — Inpatient Hospital Stay (HOSPITAL_COMMUNITY): Payer: Medicare Other | Admitting: Certified Registered"

## 2017-04-24 ENCOUNTER — Inpatient Hospital Stay (HOSPITAL_COMMUNITY): Payer: Medicare Other

## 2017-04-24 DIAGNOSIS — S72141A Displaced intertrochanteric fracture of right femur, initial encounter for closed fracture: Principal | ICD-10-CM

## 2017-04-24 HISTORY — PX: INTRAMEDULLARY (IM) NAIL INTERTROCHANTERIC: SHX5875

## 2017-04-24 LAB — MAGNESIUM: Magnesium: 2.1 mg/dL (ref 1.7–2.4)

## 2017-04-24 LAB — SURGICAL PCR SCREEN
MRSA, PCR: NEGATIVE
Staphylococcus aureus: NEGATIVE

## 2017-04-24 SURGERY — FIXATION, FRACTURE, INTERTROCHANTERIC, WITH INTRAMEDULLARY ROD
Anesthesia: General | Laterality: Right

## 2017-04-24 MED ORDER — SUGAMMADEX SODIUM 200 MG/2ML IV SOLN
INTRAVENOUS | Status: DC | PRN
Start: 2017-04-24 — End: 2017-04-24
  Administered 2017-04-24: 200 mg via INTRAVENOUS

## 2017-04-24 MED ORDER — ONDANSETRON HCL 4 MG/2ML IJ SOLN: 4.0000 mg | Freq: Once | INTRAMUSCULAR | Status: DC | PRN

## 2017-04-24 MED ORDER — CHLORHEXIDINE GLUCONATE 4 % EX LIQD: 60.0000 mL | Freq: Once | CUTANEOUS | Status: DC

## 2017-04-24 MED ORDER — ONDANSETRON HCL 4 MG/2ML IJ SOLN: 4.0000 mg | Freq: Four times a day (QID) | INTRAMUSCULAR | Status: DC | PRN

## 2017-04-24 MED ORDER — FENTANYL CITRATE (PF) 100 MCG/2ML IJ SOLN: 25.0000 ug | INTRAMUSCULAR | Status: DC | PRN

## 2017-04-24 MED ORDER — LACTATED RINGERS IV SOLN
INTRAVENOUS | Status: DC
  Administered 2017-04-24: 16:00:00 via INTRAVENOUS

## 2017-04-24 MED ORDER — LACTATED RINGERS IV SOLN
INTRAVENOUS | Status: DC | PRN
  Administered 2017-04-24: 16:00:00 via INTRAVENOUS

## 2017-04-24 MED ORDER — SUGAMMADEX SODIUM 200 MG/2ML IV SOLN
INTRAVENOUS | Status: AC
  Filled 2017-04-24: qty 2

## 2017-04-24 MED ORDER — ONDANSETRON HCL 4 MG/2ML IJ SOLN
INTRAMUSCULAR | Status: DC | PRN
  Administered 2017-04-24: 4 mg via INTRAVENOUS

## 2017-04-24 MED ORDER — CEFAZOLIN SODIUM-DEXTROSE 2-4 GM/100ML-% IV SOLN
2.0000 g | INTRAVENOUS | Status: AC
  Administered 2017-04-24: 2 g via INTRAVENOUS
  Filled 2017-04-24: qty 100

## 2017-04-24 MED ORDER — ENOXAPARIN SODIUM 40 MG/0.4ML ~~LOC~~ SOLN: 40.0000 mg | SUBCUTANEOUS | Status: DC

## 2017-04-24 MED ORDER — FENTANYL CITRATE (PF) 250 MCG/5ML IJ SOLN
INTRAMUSCULAR | Status: AC
  Filled 2017-04-24: qty 5

## 2017-04-24 MED ORDER — MENTHOL 3 MG MT LOZG: 1.0000 | LOZENGE | OROMUCOSAL | Status: DC | PRN

## 2017-04-24 MED ORDER — CHLORHEXIDINE GLUCONATE 4 % EX LIQD
CUTANEOUS | Status: AC
  Administered 2017-04-24: 15:00:00
  Filled 2017-04-24: qty 15

## 2017-04-24 MED ORDER — PROPOFOL 10 MG/ML IV BOLUS
INTRAVENOUS | Status: AC
  Filled 2017-04-24: qty 20

## 2017-04-24 MED ORDER — SODIUM CHLORIDE 0.9 % IV SOLN: INTRAVENOUS | Status: DC

## 2017-04-24 MED ORDER — ASPIRIN EC 325 MG PO TBEC: 325.0000 mg | DELAYED_RELEASE_TABLET | Freq: Every day | ORAL | 0 refills | Status: AC

## 2017-04-24 MED ORDER — EPHEDRINE SULFATE-NACL 50-0.9 MG/10ML-% IV SOSY
PREFILLED_SYRINGE | INTRAVENOUS | Status: DC | PRN
  Administered 2017-04-24: 10 mg via INTRAVENOUS

## 2017-04-24 MED ORDER — PROPOFOL 10 MG/ML IV BOLUS
INTRAVENOUS | Status: DC | PRN
  Administered 2017-04-24: 80 mg via INTRAVENOUS

## 2017-04-24 MED ORDER — MORPHINE SULFATE (PF) 4 MG/ML IV SOLN
0.5000 mg | INTRAVENOUS | Status: DC | PRN
  Administered 2017-04-24 – 2017-04-25 (×2): 0.52 mg via INTRAVENOUS
  Filled 2017-04-24 (×2): qty 1

## 2017-04-24 MED ORDER — CEFAZOLIN SODIUM-DEXTROSE 2-4 GM/100ML-% IV SOLN
2.0000 g | Freq: Four times a day (QID) | INTRAVENOUS | Status: AC
  Administered 2017-04-24 – 2017-04-25 (×2): 2 g via INTRAVENOUS
  Filled 2017-04-24 (×2): qty 100

## 2017-04-24 MED ORDER — ASPIRIN EC 325 MG PO TBEC: 325.0000 mg | DELAYED_RELEASE_TABLET | Freq: Every day | ORAL | Status: DC

## 2017-04-24 MED ORDER — MORPHINE SULFATE (PF) 4 MG/ML IV SOLN
1.0000 mg | INTRAVENOUS | Status: DC | PRN
  Administered 2017-04-24 (×2): 2 mg via INTRAVENOUS
  Filled 2017-04-24 (×2): qty 1

## 2017-04-24 MED ORDER — HYDROCODONE-ACETAMINOPHEN 5-325 MG PO TABS
1.0000 | ORAL_TABLET | Freq: Four times a day (QID) | ORAL | Status: DC | PRN
  Administered 2017-04-25: 2 via ORAL
  Administered 2017-04-26 (×2): 1 via ORAL
  Filled 2017-04-24: qty 1
  Filled 2017-04-24: qty 2
  Filled 2017-04-24: qty 1

## 2017-04-24 MED ORDER — ROCURONIUM BROMIDE 100 MG/10ML IV SOLN
INTRAVENOUS | Status: DC | PRN
  Administered 2017-04-24: 35 mg via INTRAVENOUS

## 2017-04-24 MED ORDER — ACETAMINOPHEN 500 MG PO TABS: 500.0000 mg | ORAL_TABLET | Freq: Four times a day (QID) | ORAL | 0 refills | Status: AC | PRN

## 2017-04-24 MED ORDER — ONDANSETRON HCL 4 MG PO TABS: 4.0000 mg | ORAL_TABLET | Freq: Four times a day (QID) | ORAL | Status: DC | PRN

## 2017-04-24 MED ORDER — PHENYLEPHRINE 40 MCG/ML (10ML) SYRINGE FOR IV PUSH (FOR BLOOD PRESSURE SUPPORT)
PREFILLED_SYRINGE | INTRAVENOUS | Status: DC | PRN
  Administered 2017-04-24 (×2): 80 ug via INTRAVENOUS

## 2017-04-24 MED ORDER — ACETAMINOPHEN 325 MG PO TABS
650.0000 mg | ORAL_TABLET | Freq: Four times a day (QID) | ORAL | Status: DC | PRN
  Administered 2017-04-27: 650 mg via ORAL
  Filled 2017-04-24: qty 2

## 2017-04-24 MED ORDER — METOCLOPRAMIDE HCL 5 MG PO TABS: 5.0000 mg | ORAL_TABLET | Freq: Three times a day (TID) | ORAL | Status: DC | PRN

## 2017-04-24 MED ORDER — METOCLOPRAMIDE HCL 5 MG/ML IJ SOLN: 5.0000 mg | Freq: Three times a day (TID) | INTRAMUSCULAR | Status: DC | PRN

## 2017-04-24 MED ORDER — FENTANYL CITRATE (PF) 100 MCG/2ML IJ SOLN
INTRAMUSCULAR | Status: DC | PRN
  Administered 2017-04-24: 75 ug via INTRAVENOUS

## 2017-04-24 MED ORDER — LIDOCAINE HCL (CARDIAC) 20 MG/ML IV SOLN
INTRAVENOUS | Status: DC | PRN
  Administered 2017-04-24: 40 mg via INTRAVENOUS

## 2017-04-24 MED ORDER — PHENOL 1.4 % MT LIQD: 1.0000 | OROMUCOSAL | Status: DC | PRN

## 2017-04-24 MED ORDER — ACETAMINOPHEN 650 MG RE SUPP: 650.0000 mg | Freq: Four times a day (QID) | RECTAL | Status: DC | PRN

## 2017-04-24 SURGICAL SUPPLY — 35 items
BIT DRILL CALIBRATED 4.2 (BIT) ×1 IMPLANT
BIT DRILL FLUTED FEMUR 4.2/3 (BIT) ×3 IMPLANT
BLADE HELICAL TFNA 95MM ×3 IMPLANT
BLADE SURG 15 STRL LF DISP TIS (BLADE) ×1 IMPLANT
BLADE SURG 15 STRL SS (BLADE) ×2
COVER BACK TABLE 60X90IN (DRAPES) IMPLANT
COVER PERINEAL POST (MISCELLANEOUS) ×3 IMPLANT
COVER SURGICAL LIGHT HANDLE (MISCELLANEOUS) ×6 IMPLANT
DRAPE C-ARM 42X72 X-RAY (DRAPES) ×3 IMPLANT
DRAPE STERI IOBAN 125X83 (DRAPES) ×3 IMPLANT
DRILL BIT CALIBRATED 4.2 (BIT) ×3
DRSG ADAPTIC 3X8 NADH LF (GAUZE/BANDAGES/DRESSINGS) ×3 IMPLANT
DRSG MEPILEX BORDER 4X4 (GAUZE/BANDAGES/DRESSINGS) ×3 IMPLANT
DRSG MEPILEX BORDER 4X8 (GAUZE/BANDAGES/DRESSINGS) ×3 IMPLANT
ELECT REM PT RETURN 9FT ADLT (ELECTROSURGICAL) ×3
ELECTRODE REM PT RTRN 9FT ADLT (ELECTROSURGICAL) ×1 IMPLANT
EVACUATOR 1/8 PVC DRAIN (DRAIN) IMPLANT
GLOVE BIOGEL PI IND STRL 9 (GLOVE) ×1 IMPLANT
GLOVE BIOGEL PI INDICATOR 9 (GLOVE) ×2
GLOVE SURG ORTHO 9.0 STRL STRW (GLOVE) ×3 IMPLANT
GOWN STRL REUS W/ TWL XL LVL3 (GOWN DISPOSABLE) ×3 IMPLANT
GOWN STRL REUS W/TWL XL LVL3 (GOWN DISPOSABLE) ×6
GUIDEWIRE 3.2X400 (WIRE) ×3 IMPLANT
KIT BASIN OR (CUSTOM PROCEDURE TRAY) ×3 IMPLANT
KIT ROOM TURNOVER OR (KITS) ×3 IMPLANT
LINER BOOT UNIVERSAL DISP (MISCELLANEOUS) IMPLANT
MANIFOLD NEPTUNE II (INSTRUMENTS) IMPLANT
NAIL TROCH FIX 10X170 130 (Nail) ×3 IMPLANT
NS IRRIG 1000ML POUR BTL (IV SOLUTION) ×3 IMPLANT
PACK GENERAL/GYN (CUSTOM PROCEDURE TRAY) ×3 IMPLANT
PAD ARMBOARD 7.5X6 YLW CONV (MISCELLANEOUS) ×6 IMPLANT
SCREW LOCKING 5.0X34MM (Screw) ×3 IMPLANT
STAPLER VISISTAT 35W (STAPLE) IMPLANT
SUT VIC AB 2-0 CTB1 (SUTURE) IMPLANT
WATER STERILE IRR 1000ML POUR (IV SOLUTION) ×6 IMPLANT

## 2017-04-24 NOTE — Op Note (Signed)
DATE OF SURGERY:  04/24/2017  TIME: 5:41 PM  PATIENT NAME:  [name] Collet Jr.     AGE: 83 y.o.  PRE-OPERATIVE DIAGNOSIS:  Intertroch fracture right with comminution  POST-OPERATIVE DIAGNOSIS:  SAME  PROCEDURE:  INTRAMEDULLARY (IM) NAIL INTERTROCHANTRIC right C-arm fluoroscopy  SURGEON:  Nadara MustardMarcus V Nidia Grogan  ASSISTANT:  none.  OPERATIVE IMPLANTS: Synthes trochanteric femoral nail with interlocking helical blade, locked proximally and distally.  10 x 170 mm nail with 95 mm spiral blade and 34 mm interlocking screw  PREOPERATIVE INDICATIONS:  [name] Splinter Jr. is a 83 y.o. year old who fell and suffered a hip fracture. He was brought into the ER and then admitted and optimized and then elected for surgical intervention.    The risks benefits and alternatives were discussed with the patient including but not limited to the risks of nonoperative treatment, versus surgical intervention including infection, bleeding, nerve injury, malunion, nonunion, hardware prominence, hardware failure, need for hardware removal, blood clots, cardiopulmonary complications, morbidity, mortality, among others, and they were willing to proceed.    OPERATIVE PROCEDURE:  The patient was brought to the operating room and placed in the supine position on the [place] fracture table. General anesthesia was administered. He was placed on the fracture table.  Closed reduction was performed under C-arm guidance. Time out was then performed after sterile prep with Duraprep and draped into a sterile field with the shower curtain. He received preoperative antibiotics.  Incision was made proximal to the greater trochanter. A guidewire was placed centered on the greater trochanter. Confirmation was made on AP and lateral views. The femoral canal was then reamed over the wire. Wire and reamer were removed and the nail was inserted.  A guidepin was placed through the jig into the femoral head close to the center center position of the  femoral head. The helical screw length was measured the cortex was reamed and the helical screw was inserted. C-arm was used to verify position of the spiral blade.  The compression screw proximally lock the spiral blade.  The distal interlock was placed using the jig.  Instruments were removed and final C-arm pictures AP and lateral were obtained.  The wounds were irrigated  and closed with Vicryl followed by staples and Mepilex dressing. The patient was awakened and returned to PACU in stable and satisfactory condition. There no complications. Weightbearing as tolerated.  Nadara MustardMarcus V Shalandria Elsbernd

## 2017-04-24 NOTE — Anesthesia Postprocedure Evaluation (Signed)
Anesthesia Post Note  Patient: Nicholas MellowJames M Hauswirth Jr.  Procedure(s) Performed: Procedure(s) (LRB): INTRAMEDULLARY (IM) NAIL INTERTROCHANTRIC (Right)     Patient location during evaluation: PACU Anesthesia Type: General Level of consciousness: awake, confused and patient cooperative Pain management: pain level controlled Vital Signs Assessment: post-procedure vital signs reviewed and stable Respiratory status: spontaneous breathing, nonlabored ventilation and respiratory function stable Cardiovascular status: blood pressure returned to baseline Anesthetic complications: no    Last Vitals:  Vitals:   04/24/17 1815 04/24/17 1834  BP:  (!) 106/48  Pulse: 84 86  Resp: 15 15  Temp:  36.5 C    Last Pain:  Vitals:   04/24/17 1834  TempSrc: Oral  PainSc:                  Cyana Shook COKER

## 2017-04-24 NOTE — Consult Note (Signed)
ORTHOPAEDIC CONSULTATION  REQUESTING PHYSICIAN: Rolly Salter, MD  Chief Complaint: Right hip fracture.  HPI: [name]. is a 83 y.o. male who presents with status post mechanical fall with a comminuted intertrochanteric right hip fracture.  Past Medical History:  Diagnosis Date  . Bipolar disorder, unspecified (HCC)   . History of skin cancer   . Macular degeneration, age related, exudative (HCC)    revieving intra-ocular injections left eye  . Osteoarthrosis, unspecified whether generalized or localized, shoulder region   . Sialolithiasis   . Unspecified essential hypertension    Past Surgical History:  Procedure Laterality Date  . HEMORRHOID SURGERY  1968   Social History   Social History  . Marital status: Married    Spouse name: N/A  . Number of children: 1  . Years of education: 61   Occupational History  . Programmer, systems   Social History Main Topics  . Smoking status: Never Smoker  . Smokeless tobacco: None  . Alcohol use 3.5 oz/week    7 drink(s) per week  . Drug use: Unknown  . Sexual activity: No   Other Topics Concern  . None   Social History Narrative   [employer]. College grad-[employer] [place]. Married '52. 1 son - '65.  Retired.   End of Life issues - provided packet today (02/10/11)            Family History  Problem Relation Age of Onset  . Lymphoma Mother   . Colon cancer Sister   . COPD Sister   . Diabetes Neg Hx   . Hyperlipidemia Neg Hx   . Heart disease Neg Hx   . Cancer Sister        colon cancer   - negative except otherwise stated in the family history section No Known Allergies Prior to Admission medications   Medication Sig Start Date End Date Taking? Authorizing Provider  acetaminophen (TYLENOL) 325 MG tablet Take 2 tablets (650 mg total) by mouth every 4 (four) hours as needed. 07/19/13  Yes Riebock, Emina, NP  amLODipine (NORVASC) 5 MG tablet Take 5 mg by mouth daily. 02/07/17  Yes  [provider]  sertraline (ZOLOFT) 25 MG tablet Take 25 mg by mouth at bedtime. 01/26/17  Yes [provider]  amLODipine (NORVASC) 10 MG tablet Take 1 tablet (10 mg total) by mouth daily. Calcium channel blocker for BP Patient not taking: Reported on 04/23/2017 07/31/13   Norins, Rosalyn Gess, MD  cephALEXin (KEFLEX) 500 MG capsule Take 1 capsule (500 mg total) by mouth 2 (two) times daily. Patient not taking: Reported on 04/23/2017 01/12/16   Lattie Haw, MD  docusate sodium (COLACE) 100 MG capsule Take 1 capsule (100 mg total) by mouth 2 (two) times daily. Patient not taking: Reported on 04/23/2017 08/12/15   Nonie Hoyer, PA-C  ipratropium-albuterol (DUONEB) 0.5-2.5 (3) MG/3ML SOLN Take 3 mLs by nebulization every 6 (six) hours as needed. Patient not taking: Reported on 04/23/2017 08/12/15   Nonie Hoyer, PA-C  polyethylene glycol [facility] / GLYCOLAX) packet Take 17 g by mouth daily as needed. Patient not taking: Reported on 04/23/2017 08/12/15   Bradd Canary   Dg Chest 1 View  Result Date: 04/23/2017 CLINICAL DATA:  Fall with right hip fracture. Preoperative respiratory exam. History of dementia. EXAM: CHEST 1 VIEW COMPARISON:  10/15/2015 FINDINGS: The heart size is normal. There is stable tortuosity of the thoracic aorta.  There is no evidence of pulmonary edema, consolidation, pneumothorax, nodule or pleural fluid. The visualized skeletal structures are unremarkable. IMPRESSION: No active disease in the chest. Electronically Signed   By: Irish Lack M.D.   On: 04/23/2017 12:33   Ct Head Wo Contrast  Result Date: 04/23/2017 CLINICAL DATA:  Pain following fall EXAM: CT HEAD WITHOUT CONTRAST CT CERVICAL SPINE WITHOUT CONTRAST TECHNIQUE: Multidetector CT imaging of the head and cervical spine was performed following the standard protocol without intravenous contrast. Multiplanar CT image reconstructions of the cervical spine were also generated. COMPARISON:  CT head and  CT cervical spine July 18, 2013 FINDINGS: CT HEAD FINDINGS Brain: There is moderate diffuse atrophy. Prominence of the cisterna magna is an anatomic variant. There is no intracranial mass, hemorrhage, extra-axial fluid collection, or midline shift. There is a prior infarct in the posterior mid left cerebellum, stable. There is small vessel disease throughout the centra semiovale bilaterally. There is a prior infarct in the left lentiform nucleus just lateral to the genu of left internal capsule. There is a prior small infarct in the lateral left thalamus. Small vessel disease is noted in portions of both internal and external capsules. No acute infarct is appreciable. Vascular: No hyperdense vessels evident. There is calcification in both carotid siphon regions. Skull: Bony calvarium appears intact. There is a small right frontal scalp hematoma. Sinuses/Orbits: There is mucosal thickening in the right maxillary antrum. There is mild mucosal thickening in several ethmoid air cells bilaterally. There is opacification in the inferior right frontal sinus region. Paranasal sinuses clear. Orbits appear symmetric bilaterally. Other: Mastoid air cells are clear. There is extensive degenerative change in each temporomandibular joint. CT CERVICAL SPINE FINDINGS Alignment: There is no appreciable listhesis. Skull base and vertebrae: Skull base and craniocervical junction regions appear normal. There is no evident acute fracture. There is bony remodeling at the anterior C6 vertebral body level, likely residua of old trauma. No blastic or lytic bone lesions are evident. Soft tissues and spinal canal: Prevertebral soft tissues and predental space regions are normal. There are no paraspinous lesions. No cord or canal hematoma. Disc levels: There is bony remodeling with extensive osteoarthritic change in the predental space region. There is ankylosis at C5-6 with remodeling in this area consistent with old trauma. There is  moderate disc space narrowing at C3-4 and C7-T1. There is multilevel facet osteoarthritic change. There is exit foraminal narrowing due to bony hypertrophy at C2-3, C3-4, and C4-5 bilaterally with impression on exiting nerve roots at these levels. There is no disc extrusion or central canal stenosis. Upper chest: Visualized upper lung zones are clear. Other: There is calcification in each carotid artery region, more on the left than on the right. IMPRESSION: CT head: Atrophy. Small vessel disease at multiple sites. Prior infarcts in the left cerebellum and left basal ganglia region as well as in the lateral left thalamus. No acute infarct. No hemorrhage, mass, or extra-axial fluid collection. Foci of arteriovascular calcification noted. Areas of paranasal sinus disease noted. Small right frontal scalp hematoma. Bilateral osteoarthritic change in each temporomandibular joint, present previously. CT cervical spine: No acute fracture or spondylolisthesis. Evidence of old trauma involving the anterior aspect of the C6 vertebral body with remodeling. Multilevel arthropathy. There is carotid artery calcification bilaterally. Electronically Signed   By: Bretta Bang III M.D.   On: 04/23/2017 15:49   Ct Cervical Spine Wo Contrast  Result Date: 04/23/2017 CLINICAL DATA:  Pain following fall EXAM: CT HEAD WITHOUT CONTRAST  CT CERVICAL SPINE WITHOUT CONTRAST TECHNIQUE: Multidetector CT imaging of the head and cervical spine was performed following the standard protocol without intravenous contrast. Multiplanar CT image reconstructions of the cervical spine were also generated. COMPARISON:  CT head and CT cervical spine July 18, 2013 FINDINGS: CT HEAD FINDINGS Brain: There is moderate diffuse atrophy. Prominence of the cisterna magna is an anatomic variant. There is no intracranial mass, hemorrhage, extra-axial fluid collection, or midline shift. There is a prior infarct in the posterior mid left cerebellum, stable.  There is small vessel disease throughout the centra semiovale bilaterally. There is a prior infarct in the left lentiform nucleus just lateral to the genu of left internal capsule. There is a prior small infarct in the lateral left thalamus. Small vessel disease is noted in portions of both internal and external capsules. No acute infarct is appreciable. Vascular: No hyperdense vessels evident. There is calcification in both carotid siphon regions. Skull: Bony calvarium appears intact. There is a small right frontal scalp hematoma. Sinuses/Orbits: There is mucosal thickening in the right maxillary antrum. There is mild mucosal thickening in several ethmoid air cells bilaterally. There is opacification in the inferior right frontal sinus region. Paranasal sinuses clear. Orbits appear symmetric bilaterally. Other: Mastoid air cells are clear. There is extensive degenerative change in each temporomandibular joint. CT CERVICAL SPINE FINDINGS Alignment: There is no appreciable listhesis. Skull base and vertebrae: Skull base and craniocervical junction regions appear normal. There is no evident acute fracture. There is bony remodeling at the anterior C6 vertebral body level, likely residua of old trauma. No blastic or lytic bone lesions are evident. Soft tissues and spinal canal: Prevertebral soft tissues and predental space regions are normal. There are no paraspinous lesions. No cord or canal hematoma. Disc levels: There is bony remodeling with extensive osteoarthritic change in the predental space region. There is ankylosis at C5-6 with remodeling in this area consistent with old trauma. There is moderate disc space narrowing at C3-4 and C7-T1. There is multilevel facet osteoarthritic change. There is exit foraminal narrowing due to bony hypertrophy at C2-3, C3-4, and C4-5 bilaterally with impression on exiting nerve roots at these levels. There is no disc extrusion or central canal stenosis. Upper chest: Visualized  upper lung zones are clear. Other: There is calcification in each carotid artery region, more on the left than on the right. IMPRESSION: CT head: Atrophy. Small vessel disease at multiple sites. Prior infarcts in the left cerebellum and left basal ganglia region as well as in the lateral left thalamus. No acute infarct. No hemorrhage, mass, or extra-axial fluid collection. Foci of arteriovascular calcification noted. Areas of paranasal sinus disease noted. Small right frontal scalp hematoma. Bilateral osteoarthritic change in each temporomandibular joint, present previously. CT cervical spine: No acute fracture or spondylolisthesis. Evidence of old trauma involving the anterior aspect of the C6 vertebral body with remodeling. Multilevel arthropathy. There is carotid artery calcification bilaterally. Electronically Signed   By: Bretta BangWilliam  Woodruff III M.D.   On: 04/23/2017 15:49   Dg Hip Unilat  With Pelvis 2-3 Views Right  Result Date: 04/23/2017 CLINICAL DATA:  Fall. EXAM: DG HIP (WITH OR WITHOUT PELVIS) 2-3V RIGHT COMPARISON:  None. FINDINGS: Femoral heads are located. Comminuted intratrochanteric right femur fracture with varus angulation. Vascular calcifications. IMPRESSION: Comminuted intratrochanteric right femur fracture. Electronically Signed   By: Jeronimo GreavesKyle  Talbot M.D.   On: 04/23/2017 12:31   - pertinent xrays, CT, MRI studies were reviewed and independently interpreted  Positive ROS: All  other systems have been reviewed and were otherwise negative with the exception of those mentioned in the HPI and as above.  Physical Exam: General: Patient is  not alert oriented. Psychiatric: Patient is competent for consent with normal mood and affect Lymphatic: No axillary or cervical lymphadenopathy Cardiovascular: No pedal edema Respiratory: No cyanosis, no use of accessory musculature GI: No organomegaly, abdomen is soft and non-tender  Skin: Patient's skin is intact over the right hip there are no  abrasions no ecchymosis no bruising.   Neurologic: Patient does  have protective sensation bilateral lower extremities.   MUSCULOSKELETAL:  Examination patient has shortening and internally rotated of the right hip area patient has pain with attempted range of motion of the right hip. He has a good dorsalis pedis pulse no venous stasis changes in his leg. No arterial insufficiency changes. Radiographs are reviewed which shows a comminuted intertrochanteric right hip fracture.  Assessment: Assessment: Comminuted right intertrochanteric hip fracture closed  Plan: Plan: We'll plan for intramedullary nail fixation. Risk and benefits were discussed including infection failure of the hardware need for additional surgery risk of DVT. Patient states he understands wishes to proceed at this time. Patient will need to be discharged to skilled nursing postoperatively.  Thank you for the consult and the opportunity to see Mr. Demtrius Rounds, MD [facility] [phone] 6:57 AM

## 2017-04-24 NOTE — Anesthesia Preprocedure Evaluation (Signed)
Anesthesia Evaluation  Patient identified by MRN, date of birth, ID band Patient awake    Reviewed: Allergy & Precautions, NPO status , Patient's Chart, lab work & pertinent test results  Airway Mallampati: II  TM Distance: >3 FB Neck ROM: Full    Dental  (+) Teeth Intact, Dental Advisory Given   Pulmonary    breath sounds clear to auscultation       Cardiovascular hypertension,  Rhythm:Regular Rate:Normal     Neuro/Psych    GI/Hepatic   Endo/Other    Renal/GU      Musculoskeletal   Abdominal   Peds  Hematology   Anesthesia Other Findings   Reproductive/Obstetrics                             Anesthesia Physical Anesthesia Plan  ASA: III  Anesthesia Plan: General   Post-op Pain Management:    Induction: Intravenous  PONV Risk Score and Plan: Ondansetron  Airway Management Planned: Oral ETT  Additional Equipment:   Intra-op Plan:   Post-operative Plan: Extubation in OR  Informed Consent: I have reviewed the patients History and Physical, chart, labs and discussed the procedure including the risks, benefits and alternatives for the proposed anesthesia with the patient or authorized representative who has indicated his/her understanding and acceptance.   Dental advisory given  Plan Discussed with: CRNA and Anesthesiologist  Anesthesia Plan Comments:         Anesthesia Quick Evaluation

## 2017-04-24 NOTE — Plan of Care (Signed)
Problem: Physical Regulation: Goal: Postoperative complications will be avoided or minimized Outcome: Progressing Lovenox subcutaneous VTE.

## 2017-04-24 NOTE — Transfer of Care (Signed)
Immediate Anesthesia Transfer of Care Note  Patient: Nicholas MellowJames M Cypress Jr.  Procedure(s) Performed: Procedure(s): INTRAMEDULLARY (IM) NAIL INTERTROCHANTRIC (Right)  Patient Location: PACU  Anesthesia Type:General  Level of Consciousness: drowsy and patient cooperative  Airway & Oxygen Therapy: Patient Spontanous Breathing and Patient connected to face mask oxygen  Post-op Assessment: Report given to RN and Post -op Vital signs reviewed and stable  Post vital signs: Reviewed and stable  Last Vitals:  Vitals:   04/24/17 0518 04/24/17 1744  BP: 140/64   Pulse: 80   Resp:    Temp: 36.9 C 36.7 C    Last Pain:  Vitals:   04/24/17 1744  TempSrc:   PainSc: Asleep         Complications: No apparent anesthesia complications

## 2017-04-24 NOTE — Anesthesia Procedure Notes (Signed)
Procedure Name: Intubation Date/Time: 04/24/2017 4:53 PM Performed by: Julian ReilWELTY, Nicholas Lindseth F Pre-anesthesia Checklist: Patient identified, Emergency Drugs available, Suction available, Patient being monitored and Timeout performed Patient Re-evaluated:Patient Re-evaluated prior to inductionOxygen Delivery Method: Circle system utilized Preoxygenation: Pre-oxygenation with 100% oxygen Intubation Type: IV induction Ventilation: Mask ventilation without difficulty Laryngoscope Size: Miller and 3 Grade View: Grade I Tube type: Oral Tube size: 7.5 mm Number of attempts: 1 Airway Equipment and Method: Stylet Placement Confirmation: ETT inserted through vocal cords under direct vision,  positive ETCO2 and breath sounds checked- equal and bilateral Secured at: 23 cm Tube secured with: Tape Dental Injury: Teeth and Oropharynx as per pre-operative assessment  Comments: Poor dentition noted, many missing upper front, some broken off.
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
## 2017-04-25 ENCOUNTER — Encounter (HOSPITAL_COMMUNITY): Payer: Self-pay | Admitting: Orthopedic Surgery

## 2017-04-25 LAB — CBC
HEMATOCRIT: 18.2 % — AB (ref 39.0–52.0)
Hemoglobin: 5.9 g/dL — CL (ref 13.0–17.0)
MCH: 28.8 pg (ref 26.0–34.0)
MCHC: 32.4 g/dL (ref 30.0–36.0)
MCV: 88.8 fL (ref 78.0–100.0)
PLATELETS: 144 10*3/uL — AB (ref 150–400)
RBC: 2.05 MIL/uL — ABNORMAL LOW (ref 4.22–5.81)
RDW: 15.2 % (ref 11.5–15.5)
WBC: 11.4 10*3/uL — AB (ref 4.0–10.5)

## 2017-04-25 LAB — BASIC METABOLIC PANEL
ANION GAP: 5 (ref 5–15)
BUN: 29 mg/dL — ABNORMAL HIGH (ref 6–20)
CALCIUM: 8.3 mg/dL — AB (ref 8.9–10.3)
CO2: 26 mmol/L (ref 22–32)
CREATININE: 1.03 mg/dL (ref 0.61–1.24)
Chloride: 109 mmol/L (ref 101–111)
Glucose, Bld: 141 mg/dL — ABNORMAL HIGH (ref 65–99)
Potassium: 4 mmol/L (ref 3.5–5.1)
Sodium: 140 mmol/L (ref 135–145)

## 2017-04-25 LAB — HEMOGLOBIN AND HEMATOCRIT, BLOOD
HCT: 23 % — ABNORMAL LOW (ref 39.0–52.0)
HEMOGLOBIN: 7.7 g/dL — AB (ref 13.0–17.0)

## 2017-04-25 LAB — PREPARE RBC (CROSSMATCH)

## 2017-04-25 MED ORDER — TRAMADOL HCL 50 MG PO TABS
50.0000 mg | ORAL_TABLET | Freq: Four times a day (QID) | ORAL | Status: DC | PRN
  Administered 2017-04-25 – 2017-04-28 (×8): 50 mg via ORAL
  Filled 2017-04-25 (×8): qty 1

## 2017-04-25 MED ORDER — ASPIRIN EC 81 MG PO TBEC
81.0000 mg | DELAYED_RELEASE_TABLET | Freq: Two times a day (BID) | ORAL | Status: DC
  Administered 2017-04-25 – 2017-04-28 (×7): 81 mg via ORAL
  Filled 2017-04-25 (×7): qty 1

## 2017-04-25 MED ORDER — SODIUM CHLORIDE 0.9 % IV SOLN
Freq: Once | INTRAVENOUS | Status: AC
  Administered 2017-04-25: 10 mL via INTRAVENOUS

## 2017-04-25 NOTE — Progress Notes (Signed)
Patient ID: Nicholas MellowJames M Capitano Jr., male   DOB: 28-Jun-1928, 83 y.o.   MRN: [id number] Patient awake this morning responds appropriately. Postop day 1 internal fixation for intertrochanteric comminuted fracture. Hemoglobin dropped to 5.9 most likely due to anemia of chronic disease. Minimal blood loss during surgery. Anticipate discharge to skilled nursing. Patient will be nonweightbearing right lower extremity Tylenol for pain and aspirin for DVT prophylaxis.

## 2017-04-25 NOTE — Progress Notes (Addendum)
OT Cancellation Note  Patient Details Name: Nicholas MellowJames M Fickel Jr. MRN: [id number] DOB: Jun 26, 1928   Cancelled Treatment:    Reason Eval/Treat Not Completed: Medical issues which prohibited therapy. Pt with hemoglobin 5.9 this am and awaiting 2 units RBC's. Pt additionally with continued bedrest orders and will need increased activity orders prior to initiation of therapies. Will hold OT evaluation at this time and check back as appropriate. Thank you for this referral!  Doristine Sectionharity A Tiajah Oyster, MS OTR/L  Pager: [phone]   [name] 04/25/2017, 7:45 AM

## 2017-04-25 NOTE — Progress Notes (Signed)
CRITICAL VALUE ALERT  Critical Value:  Hemoglobin 5.9  Date & Time Notied:  04/25/2017 @ 0530  Provider Notified: Hospitalists, Craige CottaKirby  Orders Received/Actions taken: Transfuse 2 units RBCs
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
## 2017-04-25 NOTE — Progress Notes (Signed)
PT Cancellation Note  Patient Details Name: Nicholas MellowJames M Bartolotta Jr. MRN: [id number] DOB: Jan 30, 1928   Cancelled Treatment:    Reason Eval/Treat Not Completed: Patient not medically ready. Pt with hemoglobin 5.9 this am and awaiting 2 units RBC's. Pt additionally with continued bedrest orders and will need increased activity orders prior to initiation of therapies. Will hold PT evaluation at this time and check back as appropriate. Thank you for this referral!    [name] 04/25/2017, 10:00 AM   Lewis ShockAshly Zyree Traynham, PT, DPT Pager #: [phone] Office #: [phone]

## 2017-04-26 DIAGNOSIS — D539 Nutritional anemia, unspecified: Secondary | ICD-10-CM

## 2017-04-26 DIAGNOSIS — I10 Essential (primary) hypertension: Secondary | ICD-10-CM

## 2017-04-26 DIAGNOSIS — F3164 Bipolar disorder, current episode mixed, severe, with psychotic features: Secondary | ICD-10-CM

## 2017-04-26 LAB — CBC
HCT: 22.5 % — ABNORMAL LOW (ref 39.0–52.0)
Hemoglobin: 7.7 g/dL — ABNORMAL LOW (ref 13.0–17.0)
MCH: 29.1 pg (ref 26.0–34.0)
MCHC: 34.2 g/dL (ref 30.0–36.0)
MCV: 84.9 fL (ref 78.0–100.0)
PLATELETS: 131 10*3/uL — AB (ref 150–400)
RBC: 2.65 MIL/uL — AB (ref 4.22–5.81)
RDW: 15.2 % (ref 11.5–15.5)
WBC: 10.9 10*3/uL — ABNORMAL HIGH (ref 4.0–10.5)

## 2017-04-26 LAB — BASIC METABOLIC PANEL
Anion gap: 4 — ABNORMAL LOW (ref 5–15)
BUN: 37 mg/dL — AB (ref 6–20)
CO2: 24 mmol/L (ref 22–32)
CREATININE: 1 mg/dL (ref 0.61–1.24)
Calcium: 8.2 mg/dL — ABNORMAL LOW (ref 8.9–10.3)
Chloride: 111 mmol/L (ref 101–111)
GFR calc Af Amer: >60 mL/min (ref >60)
GLUCOSE: 137 mg/dL — AB (ref 65–99)
POTASSIUM: 3.5 mmol/L (ref 3.5–5.1)
Sodium: 139 mmol/L (ref 135–145)

## 2017-04-26 MED ORDER — ENSURE ENLIVE PO LIQD
237.0000 mL | Freq: Two times a day (BID) | ORAL | Status: DC
  Administered 2017-04-26 – 2017-04-28 (×5): 237 mL via ORAL

## 2017-04-26 MED ORDER — HYDROCODONE-ACETAMINOPHEN 5-325 MG PO TABS: 1.0000 | ORAL_TABLET | Freq: Four times a day (QID) | ORAL | Status: DC | PRN

## 2017-04-26 NOTE — Social Work (Signed)
CSW follow-up on [facility] at families request for short term rehab.  CSW was advised by Minette BrineSarah French from [facility] that they do not provide short term rehab. They do provide outpatient rehabilitation for residents. CSW advised that family will be looking for ALF in the future as well and will f/u with her on that.  CSW advised son of same and will send offers out to SNF';s in area. CSW will need to obtain insurance auth and will f/u once SNF's make bed offers.  Keene BreathPatricia Garron Eline, LCSW Clinical Social Worker [phone]
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
## 2017-04-26 NOTE — Progress Notes (Signed)
[facility] PROGRESS NOTE  Nicholas MellowJames M Cerritos Jr. [id number]:[id number] DOB: 1928/04/24 DOA: 04/23/2017  PCP: Jacques NavyNorins, Michael E, MD  Brief History/Interval Summary: 83 year old Caucasian male with a past medical history of bipolar disorder, dementia, hypertension, who is legally blind due to macular degeneration presented after a fall resulting in intertrochanteric fracture of the right femur.  Reason for Visit: Right intertrochanteric fracture  Consultants: Orthopedics  Procedures: ORIF right hip  Antibiotics: None  Subjective/Interval History: Patient confused, likely due to his dementia. Unable to obtain much information from him.  ROS: Unable to do due to dementia  Objective:  Vital Signs  Vitals:   04/25/17 1431 04/25/17 1633 04/25/17 2000 04/26/17 0400  BP: (!) 96/49 (!) 113/57 (!) 110/53 (!) 111/94  Pulse: 80 74 79 86  Resp: 18 20 18 18   Temp: (!) 97.5 F (36.4 C) 98.6 F (37 C) 99.5 F (37.5 C) 97.9 F (36.6 C)  TempSrc: Oral Oral Oral Oral  SpO2: 100% 100% 98% 100%  Weight:      Height:        Intake/Output Summary (Last 24 hours) at 04/26/17 1524 Last data filed at 04/26/17 0900  Gross per 24 hour  Intake          2010.83 ml  Output                0 ml  Net          2010.83 ml   Filed Weights   04/23/17 1230  Weight: 52.5 kg (115 lb 11.2 oz)    General appearance: alert, distracted and no distress Resp: clear to auscultation bilaterally Cardio: regular rate and rhythm, S1, S2 normal, no murmur, click, rub or gallop GI: soft, non-tender; bowel sounds normal; no masses,  no organomegaly Extremities: No bruising noted over the right thigh area. Neurologic: Distracted. No obvious focal neurological deficits.  Lab Results:  Data Reviewed: I have personally reviewed following labs and imaging studies  CBC:  Recent Labs Lab 04/23/17 1226 04/25/17 0429 04/25/17 1819 04/26/17 0449  WBC 13.9* 11.4*  --  10.9*  NEUTROABS 12.9*  --   --   --   HGB  9.9* 5.9* 7.7* 7.7*  HCT 28.6* 18.2* 23.0* 22.5*  MCV 86.1 88.8  --  84.9  PLT 227 144*  --  131*    Basic Metabolic Panel:  Recent Labs Lab 04/23/17 1226 04/24/17 0527 04/25/17 0429 04/26/17 0449  NA 139  --  140 139  K 3.4*  --  4.0 3.5  CL 107  --  109 111  CO2 24  --  26 24  GLUCOSE 152*  --  141* 137*  BUN 26*  --  29* 37*  CREATININE 0.70  --  1.03 1.00  CALCIUM 8.9  --  8.3* 8.2*  MG  --  2.1  --   --     GFR: Estimated Creatinine Clearance: 39.4 mL/min (by C-G formula based on SCr of 1 mg/dL).   Recent Results (from the past 240 hour(s))  Surgical PCR screen     Status: None   Collection Time: 04/24/17 12:13 PM  Result Value Ref Range Status   MRSA, PCR NEGATIVE NEGATIVE Final   Staphylococcus aureus NEGATIVE NEGATIVE Final    Comment:        The Xpert SA Assay (FDA approved for NASAL specimens in patients over 81 years of age), is one component of a comprehensive surveillance program.  Test performance has been validated by  [facility] for patients greater than or equal to 79 year old. It is not intended to diagnose infection nor to guide or monitor treatment.       Radiology Studies: Dg C-arm 1-60 Min  Result Date: 04/24/2017 CLINICAL DATA:  83 year old male with right femur fracture. EXAM: OPERATIVE right HIP (WITH PELVIS IF PERFORMED) 2 VIEWS TECHNIQUE: Fluoroscopic spot image(s) were submitted for interpretation post-operatively. COMPARISON:  04/23/2017. FINDINGS: Two C-arm views reveal fixation of right intertrochanteric fracture which is incompletely assessed on present exam. IMPRESSION: Reduction right intertrochanteric fracture incompletely assessed. Electronically Signed   By: Lacy Duverney M.D.   On: 04/24/2017 18:52   Dg Hip Operative Unilat W Or W/o Pelvis Right  Result Date: 04/24/2017 CLINICAL DATA:  83 year old male with right femur fracture. EXAM: OPERATIVE right HIP (WITH PELVIS IF PERFORMED) 2 VIEWS TECHNIQUE: Fluoroscopic spot  image(s) were submitted for interpretation post-operatively. COMPARISON:  04/23/2017. FINDINGS: Two C-arm views reveal fixation of right intertrochanteric fracture which is incompletely assessed on present exam. IMPRESSION: Reduction right intertrochanteric fracture incompletely assessed. Electronically Signed   By: Lacy Duverney M.D.   On: 04/24/2017 18:52     Medications:  Scheduled: . aspirin EC  81 mg Oral BID  . feeding supplement (ENSURE ENLIVE)  237 mL Oral BID BM  . mouth rinse  15 mL Mouth Rinse BID  . sertraline  25 mg Oral QHS   Continuous: . sodium chloride 10 mL/hr at 04/26/17 1047  . methocarbamol (ROBAXIN)  IV     [id number] **OR** acetaminophen, HYDROcodone-acetaminophen, menthol-cetylpyridinium **OR** phenol, methocarbamol **OR** methocarbamol (ROBAXIN)  IV, metoCLOPramide **OR** metoCLOPramide (REGLAN) injection, ondansetron **OR** ondansetron (ZOFRAN) IV, traMADol  Assessment/Plan:  Principal Problem:   Closed comminuted intertrochanteric fracture of proximal end of right femur (HCC) Active Problems:   Essential hypertension   Bipolar disorder (HCC)   Leucocytosis   Anemia due to poor nutrition   Protein-calorie malnutrition, severe (HCC)   Hypokalemia   Unwitnessed fall    Closed comminuted intertrochanteric fracture of the right femur Status post internal fixation. Does not appear to be in any discomfort this morning. PT and OT is pending. Aspirin for DVT prophylaxis.  Anemia due to acute blood loss Likely due to operative loss. Patient was transfused 2 units. Hemoglobin is responded appropriately. Recheck tomorrow morning as well.  History of essential hypertension Stable. Norvasc was discontinued as the patient's blood pressure was noted to be in the 90 systolic. Seems to have improved. Continue to monitor.  History of dementia and bipolar disorder. Noted to be distracted and confused, which could be his baseline. No focal neurological deficits.  Continue to monitor.  Severe protein calorie malnutrition. Nutritionist to see. Nurse did raise concerns with pocketing of food in the mouth. We'll have speech therapy to do a swallow evaluation.  Leukocytosis. Most likely reactive from hip fracture. Trending down. No evidence for infectious etiology.  Hypokalemia Repleted.   DVT Prophylaxis: Aspirin    Code Status: DO NOT RESUSCITATE  Family Communication: No family at bedside  Disposition Plan: Management as outlined above. Anticipate discharge to SNF tomorrow.    LOS: 3 days   [facility]  [facility] Pager [phone] 04/26/2017, 3:24 PM  If 7PM-7AM, please contact night-coverage at [email or web address], password [facility]
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
## 2017-04-27 DIAGNOSIS — E43 Unspecified severe protein-calorie malnutrition: Secondary | ICD-10-CM

## 2017-04-27 DIAGNOSIS — D62 Acute posthemorrhagic anemia: Secondary | ICD-10-CM

## 2017-04-27 LAB — CBC
HCT: 20.7 % — ABNORMAL LOW (ref 39.0–52.0)
Hemoglobin: 6.9 g/dL — CL (ref 13.0–17.0)
MCH: 28.9 pg (ref 26.0–34.0)
MCHC: 33.3 g/dL (ref 30.0–36.0)
MCV: 86.6 fL (ref 78.0–100.0)
PLATELETS: 145 10*3/uL — AB (ref 150–400)
RBC: 2.39 MIL/uL — ABNORMAL LOW (ref 4.22–5.81)
RDW: 15.6 % — AB (ref 11.5–15.5)
WBC: 8 10*3/uL (ref 4.0–10.5)

## 2017-04-27 LAB — HEMOGLOBIN AND HEMATOCRIT, BLOOD
HEMATOCRIT: 25.3 % — AB (ref 39.0–52.0)
HEMOGLOBIN: 8.5 g/dL — AB (ref 13.0–17.0)

## 2017-04-27 LAB — HEPATIC FUNCTION PANEL
ALBUMIN: 2.5 g/dL — AB (ref 3.5–5.0)
ALT: 9 U/L — ABNORMAL LOW (ref 17–63)
AST: 19 U/L (ref 15–41)
Alkaline Phosphatase: 41 U/L (ref 38–126)
Total Bilirubin: 0.9 mg/dL (ref 0.3–1.2)
Total Protein: 4.7 g/dL — ABNORMAL LOW (ref 6.5–8.1)

## 2017-04-27 LAB — PREPARE RBC (CROSSMATCH)

## 2017-04-27 LAB — BASIC METABOLIC PANEL
ANION GAP: 5 (ref 5–15)
BUN: 29 mg/dL — ABNORMAL HIGH (ref 6–20)
CALCIUM: 8 mg/dL — AB (ref 8.9–10.3)
CO2: 26 mmol/L (ref 22–32)
Chloride: 112 mmol/L — ABNORMAL HIGH (ref 101–111)
Creatinine, Ser: 0.69 mg/dL (ref 0.61–1.24)
GLUCOSE: 103 mg/dL — AB (ref 65–99)
Potassium: 4 mmol/L (ref 3.5–5.1)
SODIUM: 143 mmol/L (ref 135–145)

## 2017-04-27 MED ORDER — SENNA 8.6 MG PO TABS
1.0000 | ORAL_TABLET | Freq: Every day | ORAL | Status: DC
  Administered 2017-04-27 – 2017-04-28 (×2): 8.6 mg via ORAL
  Filled 2017-04-27 (×2): qty 1

## 2017-04-27 MED ORDER — RESOURCE THICKENUP CLEAR PO POWD
ORAL | Status: DC | PRN
  Filled 2017-04-27: qty 125

## 2017-04-27 MED ORDER — FLEET ENEMA 7-19 GM/118ML RE ENEM
1.0000 | ENEMA | Freq: Once | RECTAL | Status: AC
  Administered 2017-04-27: 1 via RECTAL
  Filled 2017-04-27: qty 1

## 2017-04-27 MED ORDER — SODIUM CHLORIDE 0.9 % IV SOLN
Freq: Once | INTRAVENOUS | Status: AC
  Administered 2017-04-27: 10:00:00 via INTRAVENOUS

## 2017-04-27 MED ORDER — POLYETHYLENE GLYCOL 3350 17 G PO PACK
17.0000 g | PACK | Freq: Every day | ORAL | Status: DC
  Administered 2017-04-27 – 2017-04-28 (×2): 17 g via ORAL
  Filled 2017-04-27 (×2): qty 1

## 2017-04-27 NOTE — Social Work (Signed)
Insurance auth [id number] for patient to DC to [facility] when ready.  CSW following up with doctor on dc.  Keene BreathPatricia Lyah Millirons, LCSW Clinical Social Worker [phone]
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
## 2017-04-27 NOTE — Care Management Important Message (Signed)
Important Message  Patient Details  Name: Nicholas MellowJames M Rhine Jr. MRN: [id number] Date of Birth: 04-Jun-1928   Medicare Important Message Given:  No Patient was not coherent to sign.    Jarrett Chicoine 04/27/2017, 1:12 PM

## 2017-04-27 NOTE — Social Work (Addendum)
CSW called [employer] and they that they did receive clinicals earlier in the day and insurance Berkley Harveyauth is pending.   CSW advised SNF that insurance auth still pending. SNF indicated that they would need DC summary by 4:30 if patient is to dc today.  Keene BreathPatricia Floris Neuhaus, LCSW Clinical Social Worker [phone]

## 2017-04-27 NOTE — Social Work (Signed)
CSW discussed bed offers with family. Family has selected [facility].  [facility].  Keene BreathPatricia Shakeila Pfarr, LCSW Clinical Social Worker [phone]

## 2017-04-27 NOTE — Progress Notes (Signed)
[facility] PROGRESS NOTE  Nicholas MellowJames M Bonneau Jr. RUE:[id number] DOB: Jan 07, 1928 DOA: 04/23/2017  PCP: Jacques NavyNorins, Michael E, MD  Brief History/Interval Summary: 83 year old Caucasian male with a past medical history of bipolar disorder, dementia, hypertension, who is legally blind due to macular degeneration presented after a fall resulting in intertrochanteric fracture of the right femur.  Reason for Visit: Right intertrochanteric fracture  Consultants: Orthopedics  Procedures: ORIF right hip  Antibiotics: None  Subjective/Interval History: Patient remains confused, likely due to his dementia.  ROS: Unable to do due to dementia  Objective:  Vital Signs  Vitals:   04/27/17 0419 04/27/17 1012 04/27/17 1031 04/27/17 1245  BP: (!) 152/66 124/60 (!) 143/64 (!) 146/54  Pulse: 81 75 75   Resp: 18 15 16 15   Temp: 98.1 F (36.7 C) 98.7 F (37.1 C) 98.5 F (36.9 C) 98.6 F (37 C)  TempSrc: Oral Oral Axillary Oral  SpO2: 100% 100% 100% 100%  Weight:      Height:        Intake/Output Summary (Last 24 hours) at 04/27/17 1249 Last data filed at 04/27/17 1245  Gross per 24 hour  Intake              628 ml  Output                0 ml  Net              628 ml   Filed Weights   04/23/17 1230  Weight: 52.5 kg (115 lb 11.2 oz)    General appearance: Awake, alert. Distracted. Resp: Clear to auscultation bilaterally. No wheezing, rales or rhonchi Cardio: S1, S2 is normal, regular. No S3, S4. No rubs or murmurs GI: Abdomen is soft. Nontender, nondistended. Bowel sounds are present. No masses or organomegaly Extremities: No bruising noted over the surgical site. Able to move his right foot. Neurologic: Remains distracted. Awake, alert. No obvious focal neurological deficits.  Lab Results:  Data Reviewed: I have personally reviewed following labs and imaging studies  CBC:  Recent Labs Lab 04/23/17 1226 04/25/17 0429 04/25/17 1819 04/26/17 0449 04/27/17 0613  WBC  13.9* 11.4*  --  10.9* 8.0  NEUTROABS 12.9*  --   --   --   --   HGB 9.9* 5.9* 7.7* 7.7* 6.9*  HCT 28.6* 18.2* 23.0* 22.5* 20.7*  MCV 86.1 88.8  --  84.9 86.6  PLT 227 144*  --  131* 145*    Basic Metabolic Panel:  Recent Labs Lab 04/23/17 1226 04/24/17 0527 04/25/17 0429 04/26/17 0449 04/27/17 0613  NA 139  --  140 139 143  K 3.4*  --  4.0 3.5 4.0  CL 107  --  109 111 112*  CO2 24  --  26 24 26   GLUCOSE 152*  --  141* 137* 103*  BUN 26*  --  29* 37* 29*  CREATININE 0.70  --  1.03 1.00 0.69  CALCIUM 8.9  --  8.3* 8.2* 8.0*  MG  --  2.1  --   --   --     GFR: Estimated Creatinine Clearance: 49.2 mL/min (by C-G formula based on SCr of 0.69 mg/dL).   Recent Results (from the past 240 hour(s))  Surgical PCR screen     Status: None   Collection Time: 04/24/17 12:13 PM  Result Value Ref Range Status   MRSA, PCR NEGATIVE NEGATIVE Final   Staphylococcus aureus NEGATIVE NEGATIVE Final    Comment:  The Xpert SA Assay (FDA approved for NASAL specimens in patients over 80 years of age), is one component of a comprehensive surveillance program.  Test performance has been validated by [facility] for patients greater than or equal to 63 year old. It is not intended to diagnose infection nor to guide or monitor treatment.       Radiology Studies: No results found.   Medications:  Scheduled: . aspirin EC  81 mg Oral BID  . feeding supplement (ENSURE ENLIVE)  237 mL Oral BID BM  . mouth rinse  15 mL Mouth Rinse BID  . polyethylene glycol  17 g Oral Daily  . senna  1 tablet Oral Daily  . sertraline  25 mg Oral QHS   Continuous: . sodium chloride 10 mL/hr at 04/26/17 1047  . sodium chloride    . methocarbamol (ROBAXIN)  IV     ZOX:[id number] **OR** acetaminophen, HYDROcodone-acetaminophen, menthol-cetylpyridinium **OR** phenol, methocarbamol **OR** methocarbamol (ROBAXIN)  IV, metoCLOPramide **OR** metoCLOPramide (REGLAN) injection, ondansetron **OR**  ondansetron (ZOFRAN) IV, RESOURCE THICKENUP CLEAR, traMADol  Assessment/Plan:  Principal Problem:   Closed comminuted intertrochanteric fracture of proximal end of right femur (HCC) Active Problems:   Essential hypertension   Bipolar disorder (HCC)   Leucocytosis   Anemia due to poor nutrition   Protein-calorie malnutrition, severe (HCC)   Hypokalemia   Unwitnessed fall    Closed comminuted intertrochanteric fracture of the right femur Status post internal fixation. He was to be medically stable. Aspirin for DVT prophylaxis. Will need to go to skilled nursing facility when medically ready for discharge.   Anemia due to acute blood loss Likely due to operative loss. Patient was transfused 2 units 7/10. Hemoglobin has again dropped this morning. No overt bleeding noted. Check stool for occult blood. Transfuse additional unit today. Recheck hemoglobin posttransfusion and tomorrow.   History of essential hypertension Stable. Norvasc was discontinued as the patient's blood pressure was noted to be in the 90 systolic. Seems to have improved. Continue to monitor.  History of dementia and bipolar disorder. Noted to be distracted and confused, which could be his baseline. No focal neurological deficits. Continue to monitor.  Severe protein calorie malnutrition. Nutritionist following. Nurse did note pocketing of food in the mouth. Await speech therapy evaluation.   Leukocytosis. Most likely reactive from hip fracture. Trending down. No evidence for infectious etiology.  Hypokalemia Repleted.   DVT Prophylaxis: Aspirin    Code Status: DO NOT RESUSCITATE  Family Communication: No family at bedside  Disposition Plan: Management as outlined above. Discharge when hemoglobin remains stable.    LOS: 4 days   [facility]  [facility] Pager [phone] 04/27/2017, 12:49 PM  If 7PM-7AM, please contact night-coverage at [email or web address], password [facility]

## 2017-04-27 NOTE — Care Management Important Message (Signed)
Important Message  Patient Details  Name: Nicholas MellowJames M Calderone Jr. MRN: [id number] Date of Birth: 1927-11-03   Medicare Important Message Given:  Yes    Koua Deeg 04/27/2017, 12:59 PM

## 2017-04-28 ENCOUNTER — Encounter (HOSPITAL_COMMUNITY): Payer: Self-pay | Admitting: Orthopedic Surgery

## 2017-04-28 ENCOUNTER — Inpatient Hospital Stay (HOSPITAL_COMMUNITY): Payer: Medicare Other

## 2017-04-28 LAB — CBC
HCT: 23.6 % — ABNORMAL LOW (ref 39.0–52.0)
Hemoglobin: 7.9 g/dL — ABNORMAL LOW (ref 13.0–17.0)
MCH: 29.3 pg (ref 26.0–34.0)
MCHC: 33.5 g/dL (ref 30.0–36.0)
MCV: 87.4 fL (ref 78.0–100.0)
Platelets: 171 10*3/uL (ref 150–400)
RBC: 2.7 MIL/uL — ABNORMAL LOW (ref 4.22–5.81)
RDW: 15.7 % — ABNORMAL HIGH (ref 11.5–15.5)
WBC: 7.5 10*3/uL (ref 4.0–10.5)

## 2017-04-28 LAB — TYPE AND SCREEN
ABO/RH(D): A NEG
ANTIBODY SCREEN: NEGATIVE
UNIT DIVISION: 0
UNIT DIVISION: 0
Unit division: 0
Unit division: 0

## 2017-04-28 LAB — BPAM RBC
BLOOD PRODUCT EXPIRATION DATE: 201807132359
BLOOD PRODUCT EXPIRATION DATE: 201807142359
Blood Product Expiration Date: 201807312359
Blood Product Expiration Date: 201808012359
ISSUE DATE / TIME: 201807101402
ISSUE DATE / TIME: 201807101004
ISSUE DATE / TIME: 201807101144
ISSUE DATE / TIME: 201807121002
UNIT TYPE AND RH: 9500
Unit Type and Rh: 600
Unit Type and Rh: 600
Unit Type and Rh: 9500

## 2017-04-28 LAB — BASIC METABOLIC PANEL
Anion gap: 4 — ABNORMAL LOW (ref 5–15)
BUN: 24 mg/dL — AB (ref 6–20)
CHLORIDE: 111 mmol/L (ref 101–111)
CO2: 26 mmol/L (ref 22–32)
CREATININE: 0.64 mg/dL (ref 0.61–1.24)
Calcium: 8.1 mg/dL — ABNORMAL LOW (ref 8.9–10.3)
GFR calc Af Amer: >60 mL/min (ref >60)
GFR calc non Af Amer: >60 mL/min (ref >60)
GLUCOSE: 95 mg/dL (ref 65–99)
Potassium: 4 mmol/L (ref 3.5–5.1)
SODIUM: 141 mmol/L (ref 135–145)

## 2017-04-28 MED ORDER — ENSURE ENLIVE PO LIQD: 237.0000 mL | Freq: Two times a day (BID) | ORAL | 12 refills | Status: DC

## 2017-04-28 MED ORDER — SENNA 8.6 MG PO TABS: 1.0000 | ORAL_TABLET | Freq: Every day | ORAL | 0 refills | Status: AC

## 2017-04-28 MED ORDER — METHOCARBAMOL 500 MG PO TABS: 500.0000 mg | ORAL_TABLET | Freq: Four times a day (QID) | ORAL | 0 refills | Status: AC | PRN

## 2017-04-28 MED ORDER — POLYETHYLENE GLYCOL 3350 17 G PO PACK: 17.0000 g | PACK | Freq: Two times a day (BID) | ORAL | 0 refills | Status: AC

## 2017-04-28 MED ORDER — TRAMADOL HCL 50 MG PO TABS: 50.0000 mg | ORAL_TABLET | Freq: Four times a day (QID) | ORAL | 0 refills | Status: DC | PRN

## 2017-04-28 NOTE — Progress Notes (Signed)
Pt ready for d/c to SNF today per MD. [facility] all questions [facility]. Pt will be transferred to facility via [facility]. Pt's wife was notified via voicemail that [facility] had arrived to transport pt.   [place], Latricia HeftKorie G

## 2017-04-28 NOTE — Progress Notes (Signed)
Patient will discharge to [facility] Anticipated discharge date: 7/13 Family notified: pt wife Transportation by [facility]- scheduled at 3pm Report #: [id number] [id number]  CSW signing off.  Burna SisJenna H. Alvena Kiernan, LCSW Clinical Social Worker [phone]

## 2017-04-28 NOTE — Progress Notes (Signed)
Modified Barium Swallow Progress Note  Patient Details  Name: Nicholas MellowJames M Cordova Jr. MRN: [id number] Date of Birth: April 08, 1928  Today's Date: 04/28/2017  Modified Barium Swallow completed.  Full report located under Chart Review in the Imaging Section.  Brief recommendations include the following:  Clinical Impression  Pt exhbits a suspected primary esophageal dysphagia and possible cervical esophageal dysphagia. He was unable to swallow thin barium given multiple trials. Held in oral cavity despite tactile cues with SLP assisting with head extension, verbal cues and dry spoons. He expectorated 6-7 trials attempted into emesis basin. During yesterday's swallow assessment at bedside and this morning, pt was able to transit nectar thick liquids to initiate a swallow however unable to do so during study. Two spontaneous swallows of lingual and valleular reside were observed without laryngeal intrusion. Cervical esophagus was not fully in view howeve suspect decreased relaxation. MBS does not diagnose below the level of the upper esophageal dysphagia however esophagus scanned revealing what appeared to be mild esophgeal residue (may have been increased if more barium consumed) with suspected decreased esophageal peristalsis. Uncertain if pt subconsciously holding in mouth due to fear of aspiration versus anxiety or taste in pt with dementia or combination. Consider esophageal work up. Recommend continuing Dys 2 and nectar thick liquids, crush pills, full supervision, stay upright after meals and alternate liquids and solids.    Swallow Evaluation Recommendations   Recommended Consults: Consider esophageal assessment   SLP Diet Recommendations: Dysphagia 2 (Fine chop) solids;Nectar thick liquid   Liquid Administration via: Cup   Medication Administration: Crushed with puree   Supervision: Patient able to self feed;Full supervision/cueing for compensatory strategies;Staff to assist with self feeding   Compensations: Slow rate;Small sips/bites;Minimize environmental distractions;Lingual sweep for clearance of pocketing   Postural Changes: Remain semi-upright after after feeds/meals (Comment);Seated upright at 90 degrees   Oral Care Recommendations: Oral care BID        [name], [name] 04/28/2017,11:21 AM   [name]
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
## 2017-04-28 NOTE — Clinical Social Work Placement (Signed)
   CLINICAL SOCIAL WORK PLACEMENT  NOTE  Date:  04/28/2017  Patient Details  Name: Nicholas MellowJames M Broxson Jr. MRN: [id number] Date of Birth: 12-22-1927  Clinical Social Work is seeking post-discharge placement for this patient at the Skilled  Nursing Facility level of care (*CSW will initial, date and re-position this form in  chart as items are completed):  Yes   Patient/family provided with [facility] Clinical Social Work Department's list of facilities offering this level of care within the geographic area requested by the patient (or if unable, by the patient's family).  Yes   Patient/family informed of their freedom to choose among providers that offer the needed level of care, that participate in Medicare, Medicaid or managed care program needed by the patient, have an available bed and are willing to accept the patient.  Yes   Patient/family informed of [facility] ownership interest in [facility] and [facility], as well as of the fact that they are under no obligation to receive care at these facilities.  PASRR submitted to EDS on       PASRR number received on       Existing PASRR number confirmed on 04/26/17     FL2 transmitted to all facilities in geographic area requested by pt/family on 04/26/17     FL2 transmitted to all facilities within larger geographic area on       Patient informed that his/her managed care company has contracts with or will negotiate with certain facilities, including the following:        Yes   Patient/family informed of bed offers received.  [facility]     Physician recommends and patient chooses bed at      [facility] on 04/28/17.  Patient to be transferred to facility by ptar     Patient family notified on 04/28/17 of transfer.  Name of family member notified:  wife     PHYSICIAN Please sign FL2, Please sign DNR     Additional Comment:     _______________________________________________ Burna SisUris, Shameeka Silliman H, LCSW 04/28/2017, 12:31 PM
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
## 2017-04-28 NOTE — Discharge Instructions (Signed)
Hip Fracture A hip fracture is a fracture of the upper part of your thigh bone (femur). What are the causes? A hip fracture is caused by a direct blow to the side of your hip. This is usually the result of a fall but can occur in other circumstances, such as an automobile accident. What increases the risk? There is an increased risk of hip fractures in people with:  An unsteady walking pattern (gait) and those with conditions that contribute to poor balance, such as Parkinson's disease or dementia.  Osteopenia and osteoporosis.  Cancer that spreads to the leg bones.  Certain metabolic diseases.  What are the signs or symptoms? Symptoms of hip fracture include:  Pain over the injured hip.  Inability to put weight on the leg in which the fracture occurred (although, some patients are able to walk after a hip fracture).  Toes and foot of the affected leg point outward when you lie down.  How is this diagnosed? A physical exam can determine if a hip fracture is likely to have occurred. X-ray exams are needed to confirm the fracture and to look for other injuries. The X-ray exam can help to determine the type of hip fracture. Rarely, the fracture is not visible on an X-ray image and a CT scan or MRI will have to be done. How is this treated? The treatment for a fracture is usually surgery. This means using a screw, nail, or rod to hold the bones in place. Follow these instructions at home: Take all medicines as directed by your health care provider. Contact a health care provider if: Pain continues, even after taking pain medicine. This information is not intended to replace advice given to you by your health care provider. Make sure you discuss any questions you have with your health care provider. Document Released: 10/03/2005 Document Revised: 03/10/2016 Document Reviewed: 05/15/2013 Elsevier Interactive Patient Education  2017 Elsevier Inc.  

## 2017-05-08 ENCOUNTER — Encounter (INDEPENDENT_AMBULATORY_CARE_PROVIDER_SITE_OTHER): Payer: Self-pay | Admitting: Family

## 2017-05-08 ENCOUNTER — Ambulatory Visit (INDEPENDENT_AMBULATORY_CARE_PROVIDER_SITE_OTHER): Payer: Medicare Other | Admitting: Family

## 2017-05-08 ENCOUNTER — Ambulatory Visit (INDEPENDENT_AMBULATORY_CARE_PROVIDER_SITE_OTHER): Payer: Medicare Other

## 2017-05-08 VITALS — Ht 70.0 in | Wt 115.0 lb

## 2017-05-08 DIAGNOSIS — S72141D Displaced intertrochanteric fracture of right femur, subsequent encounter for closed fracture with routine healing: Secondary | ICD-10-CM
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
## 2017-06-05 ENCOUNTER — Ambulatory Visit (INDEPENDENT_AMBULATORY_CARE_PROVIDER_SITE_OTHER): Payer: Medicare Other | Admitting: Orthopedic Surgery

## 2017-06-05 ENCOUNTER — Encounter (INDEPENDENT_AMBULATORY_CARE_PROVIDER_SITE_OTHER): Payer: Self-pay | Admitting: Orthopedic Surgery

## 2017-06-05 ENCOUNTER — Ambulatory Visit (INDEPENDENT_AMBULATORY_CARE_PROVIDER_SITE_OTHER): Payer: Medicare Other

## 2017-06-05 VITALS — Ht 70.0 in | Wt 115.0 lb

## 2017-06-05 DIAGNOSIS — M25551 Pain in right hip: Secondary | ICD-10-CM | POA: Diagnosis not present
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
## 2017-07-04 ENCOUNTER — Ambulatory Visit (INDEPENDENT_AMBULATORY_CARE_PROVIDER_SITE_OTHER): Payer: Medicare Other | Admitting: Orthopedic Surgery

## 2017-07-04 ENCOUNTER — Encounter (INDEPENDENT_AMBULATORY_CARE_PROVIDER_SITE_OTHER): Payer: Self-pay | Admitting: Orthopedic Surgery

## 2017-07-04 ENCOUNTER — Ambulatory Visit (INDEPENDENT_AMBULATORY_CARE_PROVIDER_SITE_OTHER): Payer: Medicare Other

## 2017-07-04 DIAGNOSIS — S72141D Displaced intertrochanteric fracture of right femur, subsequent encounter for closed fracture with routine healing: Secondary | ICD-10-CM
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
## 2017-08-01 ENCOUNTER — Ambulatory Visit (INDEPENDENT_AMBULATORY_CARE_PROVIDER_SITE_OTHER): Payer: Medicare Other

## 2017-08-01 ENCOUNTER — Ambulatory Visit (INDEPENDENT_AMBULATORY_CARE_PROVIDER_SITE_OTHER): Payer: Medicare Other | Admitting: Orthopedic Surgery

## 2017-08-01 ENCOUNTER — Encounter (INDEPENDENT_AMBULATORY_CARE_PROVIDER_SITE_OTHER): Payer: Self-pay | Admitting: Orthopedic Surgery

## 2017-08-01 VITALS — BP 129/75 | HR 72

## 2017-08-01 DIAGNOSIS — S72141D Displaced intertrochanteric fracture of right femur, subsequent encounter for closed fracture with routine healing: Secondary | ICD-10-CM
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
## 2017-08-31 ENCOUNTER — Other Ambulatory Visit: Payer: Self-pay

## 2017-08-31 ENCOUNTER — Encounter (HOSPITAL_COMMUNITY): Payer: Self-pay

## 2017-08-31 ENCOUNTER — Emergency Department (HOSPITAL_COMMUNITY)
Admission: EM | Admit: 2017-08-31 | Discharge: 2017-08-31 | Disposition: A | Discharge status: Home / Self Care | Payer: Medicare Other | Attending: Emergency Medicine | Admitting: Emergency Medicine

## 2017-08-31 ENCOUNTER — Emergency Department (HOSPITAL_COMMUNITY): Payer: Medicare Other

## 2017-08-31 DIAGNOSIS — S0181XA Laceration without foreign body of other part of head, initial encounter: Secondary | ICD-10-CM

## 2017-08-31 DIAGNOSIS — R51 Headache: Secondary | ICD-10-CM | POA: Diagnosis not present

## 2017-08-31 DIAGNOSIS — I10 Essential (primary) hypertension: Secondary | ICD-10-CM | POA: Diagnosis not present

## 2017-08-31 DIAGNOSIS — W06XXXA Fall from bed, initial encounter: Secondary | ICD-10-CM | POA: Insufficient documentation

## 2017-08-31 DIAGNOSIS — D649 Anemia, unspecified: Secondary | ICD-10-CM | POA: Diagnosis not present

## 2017-08-31 DIAGNOSIS — Z79899 Other long term (current) drug therapy: Secondary | ICD-10-CM | POA: Insufficient documentation

## 2017-08-31 DIAGNOSIS — Y9389 Activity, other specified: Secondary | ICD-10-CM | POA: Diagnosis not present

## 2017-08-31 DIAGNOSIS — Z7982 Long term (current) use of aspirin: Secondary | ICD-10-CM | POA: Diagnosis not present

## 2017-08-31 DIAGNOSIS — R410 Disorientation, unspecified: Secondary | ICD-10-CM | POA: Diagnosis not present

## 2017-08-31 DIAGNOSIS — Z23 Encounter for immunization: Secondary | ICD-10-CM | POA: Diagnosis not present

## 2017-08-31 DIAGNOSIS — S0990XA Unspecified injury of head, initial encounter: Secondary | ICD-10-CM

## 2017-08-31 DIAGNOSIS — Y929 Unspecified place or not applicable: Secondary | ICD-10-CM | POA: Diagnosis not present

## 2017-08-31 DIAGNOSIS — Y999 Unspecified external cause status: Secondary | ICD-10-CM | POA: Insufficient documentation

## 2017-08-31 DIAGNOSIS — S0993XA Unspecified injury of face, initial encounter: Secondary | ICD-10-CM | POA: Diagnosis present

## 2017-08-31 MED ORDER — LIDOCAINE-EPINEPHRINE (PF) 2 %-1:200000 IJ SOLN
10.0000 mL | Freq: Once | INTRAMUSCULAR | Status: AC
  Administered 2017-08-31: 10 mL
  Filled 2017-08-31: qty 20

## 2017-08-31 MED ORDER — HYDROGEN PEROXIDE 3 % EX SOLN
CUTANEOUS | Status: AC
  Filled 2017-08-31: qty 473

## 2017-08-31 MED ORDER — TETANUS-DIPHTH-ACELL PERTUSSIS 5-2.5-18.5 LF-MCG/0.5 IM SUSP
0.5000 mL | Freq: Once | INTRAMUSCULAR | Status: AC
  Administered 2017-08-31: 0.5 mL via INTRAMUSCULAR
  Filled 2017-08-31: qty 0.5

## 2017-08-31 MED ORDER — BACITRACIN ZINC 500 UNIT/GM EX OINT: TOPICAL_OINTMENT | Freq: Two times a day (BID) | CUTANEOUS | Status: DC
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
## 2017-08-31 NOTE — Discharge Instructions (Signed)
Wash wound daily with soap and water and place a thin layer of bacitracin ointment over the wound.  Signs of infection include redness around the wound, drainage from the wound, more pain, fever.  If you think that [name] is developing an infection, get him seen by his physician.  Sutures to come out in 5 days .blood pressure should be rechecked in 1 week.  Today's was elevated at 161/84

## 2017-08-31 NOTE — ED Triage Notes (Signed)
[facility] c/o unwitnessed fall with laceration to his face. Patient's facility stipulates the patient rolled out of bed, but unsure how patient fell. Patient was found on the floor at approx 1425. Per report, patient does not take any blood thinners other than ASA daily. Per report, patient was actively bleeding when EMS arrived- ABD pad dressing placed to patient's forehead. Patient has history of dementia, dysphagia, and HTN. Patient at neurologic baseline per facility. Patient denies pain anywhere else on his body.

## 2017-08-31 NOTE — ED Provider Notes (Signed)
I was asked to repair this patient's laceration, I was not involved in any other medical decision making or care.  LACERATION REPAIR Performed by: Harvie HeckSamantha Petrucelli Authorized by: Harvie HeckSamantha Petrucelli Consent: Verbal consent obtained. Risks and benefits: risks, benefits and alternatives were discussed Consent given by: patient and patient's spouse Patient identity confirmed: provided demographic data Prepped and Draped in normal sterile fashion Wound explored Laceration Location: scalp  Laceration Length: 3 cm  No Foreign Bodies seen or palpated.   Anesthesia: local infiltration  Local anesthetic: lidocaine 2% with epinephrine  Anesthetic total: 8 ml  Irrigation method: syringe Amount of cleaning: standard  There was a small arterial bleed which Dr. Ethelda ChickJacubowitz placed a simple interrupted suture in to stop bleeding.   Skin closure: 3-0 Prolene  Number of sutures: 7 simple interrupted  Patient tolerance: Patient tolerated the procedure well with no immediate complications.      Desmond Lopeetrucelli, Samantha R, PA-C 08/31/17 Willette Alma2003    Jacubowitz, Sam, MD 09/01/17 [phone]

## 2017-10-28 ENCOUNTER — Emergency Department (HOSPITAL_COMMUNITY)
Admission: EM | Admit: 2017-10-28 | Discharge: 2017-10-28 | Disposition: A | Discharge status: Home / Self Care | Payer: Medicare Other | Attending: Emergency Medicine | Admitting: Emergency Medicine

## 2017-10-28 ENCOUNTER — Emergency Department (HOSPITAL_COMMUNITY): Payer: Medicare Other

## 2017-10-28 DIAGNOSIS — W19XXXA Unspecified fall, initial encounter: Secondary | ICD-10-CM

## 2017-10-28 DIAGNOSIS — Z85828 Personal history of other malignant neoplasm of skin: Secondary | ICD-10-CM | POA: Insufficient documentation

## 2017-10-28 DIAGNOSIS — F0390 Unspecified dementia without behavioral disturbance: Secondary | ICD-10-CM | POA: Diagnosis not present

## 2017-10-28 DIAGNOSIS — Z7982 Long term (current) use of aspirin: Secondary | ICD-10-CM | POA: Insufficient documentation

## 2017-10-28 DIAGNOSIS — S0083XA Contusion of other part of head, initial encounter: Secondary | ICD-10-CM | POA: Diagnosis not present

## 2017-10-28 DIAGNOSIS — Z23 Encounter for immunization: Secondary | ICD-10-CM | POA: Insufficient documentation

## 2017-10-28 DIAGNOSIS — Y999 Unspecified external cause status: Secondary | ICD-10-CM | POA: Insufficient documentation

## 2017-10-28 DIAGNOSIS — Y92129 Unspecified place in nursing home as the place of occurrence of the external cause: Secondary | ICD-10-CM | POA: Diagnosis not present

## 2017-10-28 DIAGNOSIS — I10 Essential (primary) hypertension: Secondary | ICD-10-CM | POA: Diagnosis not present

## 2017-10-28 DIAGNOSIS — S0081XA Abrasion of other part of head, initial encounter: Secondary | ICD-10-CM | POA: Insufficient documentation

## 2017-10-28 DIAGNOSIS — Z79899 Other long term (current) drug therapy: Secondary | ICD-10-CM | POA: Insufficient documentation

## 2017-10-28 DIAGNOSIS — S098XXA Other specified injuries of head, initial encounter: Secondary | ICD-10-CM | POA: Diagnosis present

## 2017-10-28 DIAGNOSIS — Y939 Activity, unspecified: Secondary | ICD-10-CM | POA: Diagnosis not present

## 2017-10-28 DIAGNOSIS — W1830XA Fall on same level, unspecified, initial encounter: Secondary | ICD-10-CM | POA: Insufficient documentation

## 2017-10-28 MED ORDER — TETANUS-DIPHTH-ACELL PERTUSSIS 5-2.5-18.5 LF-MCG/0.5 IM SUSP
0.5000 mL | Freq: Once | INTRAMUSCULAR | Status: AC
  Administered 2017-10-28: 0.5 mL via INTRAMUSCULAR
  Filled 2017-10-28: qty 0.5

## 2017-10-28 NOTE — ED Notes (Signed)
Pt wound was cleaned to see if pt would need stitches.

## 2017-10-28 NOTE — ED Notes (Signed)
Pt was able to ambulate with 1 person assist. Pt uses an ambulation device normally.

## 2017-10-28 NOTE — ED Notes (Signed)
[facility] called for pt 

## 2017-10-28 NOTE — ED Triage Notes (Signed)
[facility] Pt suffered an unwitnessed fall and has a lac to rt temple. Hx dementia and dysphagia. Is on a thickened diet. CAOx2 which is baseline for pt. Spinal precautions due to dementia. Staff believes the pt fell while trying to get out of bed.

## 2017-10-28 NOTE — Discharge Instructions (Signed)
Follow-up with your doctor.  Return to the ED if you develop new or worsening symptoms. °
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
## 2018-01-11 ENCOUNTER — Other Ambulatory Visit: Payer: Self-pay

## 2018-01-11 ENCOUNTER — Emergency Department (HOSPITAL_COMMUNITY): Payer: Medicare Other

## 2018-01-11 ENCOUNTER — Emergency Department (HOSPITAL_COMMUNITY)
Admission: EM | Admit: 2018-01-11 | Discharge: 2018-01-11 | Disposition: A | Discharge status: Skilled Nursing Facility | Payer: Medicare Other | Attending: Emergency Medicine | Admitting: Emergency Medicine

## 2018-01-11 ENCOUNTER — Encounter (HOSPITAL_COMMUNITY): Payer: Self-pay | Admitting: Emergency Medicine

## 2018-01-11 DIAGNOSIS — Z79899 Other long term (current) drug therapy: Secondary | ICD-10-CM | POA: Diagnosis not present

## 2018-01-11 DIAGNOSIS — Z7982 Long term (current) use of aspirin: Secondary | ICD-10-CM | POA: Insufficient documentation

## 2018-01-11 DIAGNOSIS — F809 Developmental disorder of speech and language, unspecified: Secondary | ICD-10-CM

## 2018-01-11 DIAGNOSIS — Z85828 Personal history of other malignant neoplasm of skin: Secondary | ICD-10-CM | POA: Diagnosis not present

## 2018-01-11 DIAGNOSIS — R4182 Altered mental status, unspecified: Secondary | ICD-10-CM | POA: Diagnosis present

## 2018-01-11 DIAGNOSIS — I10 Essential (primary) hypertension: Secondary | ICD-10-CM | POA: Insufficient documentation

## 2018-01-11 DIAGNOSIS — R479 Unspecified speech disturbances: Secondary | ICD-10-CM | POA: Insufficient documentation

## 2018-01-11 LAB — COMPREHENSIVE METABOLIC PANEL
ALBUMIN: 3.4 g/dL — AB (ref 3.5–5.0)
ALK PHOS: 66 U/L (ref 38–126)
ALT: 11 U/L — AB (ref 17–63)
AST: 18 U/L (ref 15–41)
Anion gap: 9 (ref 5–15)
BILIRUBIN TOTAL: 0.5 mg/dL (ref 0.3–1.2)
BUN: 21 mg/dL — ABNORMAL HIGH (ref 6–20)
CALCIUM: 8.6 mg/dL — AB (ref 8.9–10.3)
CO2: 25 mmol/L (ref 22–32)
CREATININE: 0.73 mg/dL (ref 0.61–1.24)
Chloride: 108 mmol/L (ref 101–111)
GFR calc Af Amer: >60 mL/min (ref >60)
GFR calc non Af Amer: >60 mL/min (ref >60)
GLUCOSE: 78 mg/dL (ref 65–99)
Potassium: 4.1 mmol/L (ref 3.5–5.1)
SODIUM: 142 mmol/L (ref 135–145)
TOTAL PROTEIN: 5.9 g/dL — AB (ref 6.5–8.1)

## 2018-01-11 LAB — CBC
HCT: 33.2 % — ABNORMAL LOW (ref 39.0–52.0)
Hemoglobin: 10.8 g/dL — ABNORMAL LOW (ref 13.0–17.0)
MCH: 29.6 pg (ref 26.0–34.0)
MCHC: 32.5 g/dL (ref 30.0–36.0)
MCV: 91 fL (ref 78.0–100.0)
PLATELETS: 199 10*3/uL (ref 150–400)
RBC: 3.65 MIL/uL — ABNORMAL LOW (ref 4.22–5.81)
RDW: 15.8 % — AB (ref 11.5–15.5)
WBC: 6.5 10*3/uL (ref 4.0–10.5)

## 2018-01-11 LAB — CBG MONITORING, ED: GLUCOSE-CAPILLARY: 78 mg/dL (ref 65–99)

## 2018-01-11 NOTE — ED Triage Notes (Signed)
Per EMS pt hx of dementia disoriented x4 but normally verbal; nonverbal with shift change.

## 2018-01-11 NOTE — ED Notes (Signed)
Campos MD aware of blood pressures obtained 1332 and 1334.
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
## 2018-01-11 NOTE — ED Notes (Signed)
Bed: AV40WA16 Expected date:  Expected time:  Means of arrival:  Comments: 84 yo m ams

## 2018-03-12 ENCOUNTER — Emergency Department (HOSPITAL_COMMUNITY)
Admission: EM | Admit: 2018-03-12 | Discharge: 2018-03-12 | Disposition: A | Discharge status: Skilled Nursing Facility | Payer: Medicare Other | Attending: Emergency Medicine | Admitting: Emergency Medicine

## 2018-03-12 ENCOUNTER — Encounter (HOSPITAL_COMMUNITY): Payer: Self-pay | Admitting: Emergency Medicine

## 2018-03-12 ENCOUNTER — Other Ambulatory Visit: Payer: Self-pay

## 2018-03-12 ENCOUNTER — Emergency Department (HOSPITAL_COMMUNITY): Payer: Medicare Other

## 2018-03-12 DIAGNOSIS — Z7982 Long term (current) use of aspirin: Secondary | ICD-10-CM | POA: Insufficient documentation

## 2018-03-12 DIAGNOSIS — S0990XA Unspecified injury of head, initial encounter: Secondary | ICD-10-CM

## 2018-03-12 DIAGNOSIS — Y939 Activity, unspecified: Secondary | ICD-10-CM | POA: Insufficient documentation

## 2018-03-12 DIAGNOSIS — F0390 Unspecified dementia without behavioral disturbance: Secondary | ICD-10-CM | POA: Insufficient documentation

## 2018-03-12 DIAGNOSIS — Y999 Unspecified external cause status: Secondary | ICD-10-CM | POA: Insufficient documentation

## 2018-03-12 DIAGNOSIS — Z85828 Personal history of other malignant neoplasm of skin: Secondary | ICD-10-CM | POA: Insufficient documentation

## 2018-03-12 DIAGNOSIS — Y92129 Unspecified place in nursing home as the place of occurrence of the external cause: Secondary | ICD-10-CM | POA: Diagnosis not present

## 2018-03-12 DIAGNOSIS — W19XXXA Unspecified fall, initial encounter: Secondary | ICD-10-CM | POA: Diagnosis not present

## 2018-03-12 DIAGNOSIS — S0101XA Laceration without foreign body of scalp, initial encounter: Secondary | ICD-10-CM | POA: Insufficient documentation

## 2018-03-12 HISTORY — DX: Dysphagia, unspecified: R13.10

## 2018-03-12 MED ORDER — HYDROGEN PEROXIDE 3 % EX SOLN
Freq: Once | CUTANEOUS | Status: AC
Start: 2018-03-12 — End: 2018-03-12
  Administered 2018-03-12: 02:00:00 via TOPICAL
  Filled 2018-03-12: qty 473

## 2018-03-12 MED ORDER — LIDOCAINE-EPINEPHRINE (PF) 2 %-1:200000 IJ SOLN
20.0000 mL | Freq: Once | INTRAMUSCULAR | Status: AC
  Administered 2018-03-12: 20 mL
  Filled 2018-03-12: qty 20

## 2018-03-12 NOTE — ED Notes (Signed)
[facility] present to transport pt back to [facility]

## 2018-03-12 NOTE — ED Notes (Signed)
Bed: ZO10 Expected date:  Expected time:  Means of arrival:  Comments: 42 m fall/laceration

## 2018-03-12 NOTE — ED Notes (Signed)
[facility] called for transport.  
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
## 2018-03-12 NOTE — Discharge Instructions (Addendum)
CT scan is reassuring. Follow up your doctor in 1 week for staple removal. Return to the ED if you develop new or worsening symptoms.

## 2018-03-12 NOTE — ED Notes (Signed)
Pt normally ambulates with a walker and was ambulated using one this morning. He walked with steady gait, voiced no pain, and would have kept walking even further if I hadn't redirected him back to his room.

## 2018-03-12 NOTE — ED Notes (Signed)
ED Provider at bedside. 

## 2018-03-12 NOTE — ED Triage Notes (Signed)
Pt BIB EMS from [facility] s/p unwitnessed fall with a laceration to the back of his head, bleeding controlled. Patient history of dementia. Patient states he got dizzy and fell. No hx thinners. No neck or back pain.

## 2018-03-19 IMAGING — CT CT HEAD W/O CM
4 of 6 series · 17 of 47 positions shown, 18 images · non-contrast
Comparison: CT 04/23/2017

CLINICAL DATA: Fall.  Head injury

EXAM:
CT HEAD WITHOUT CONTRAST
CT CERVICAL SPINE WITHOUT CONTRAST
TECHNIQUE: Multidetector CT imaging of the head and cervical spine was
performed following the standard protocol without intravenous
contrast. Multiplanar CT image reconstructions of the cervical spine
were also generated.

[Series 2: head w/o · axial · non-contrast · 0.43mm/px · z∈[-112,-27]mm · 4 of 29 slices shown, 5 images]
[im 6/29  brain]
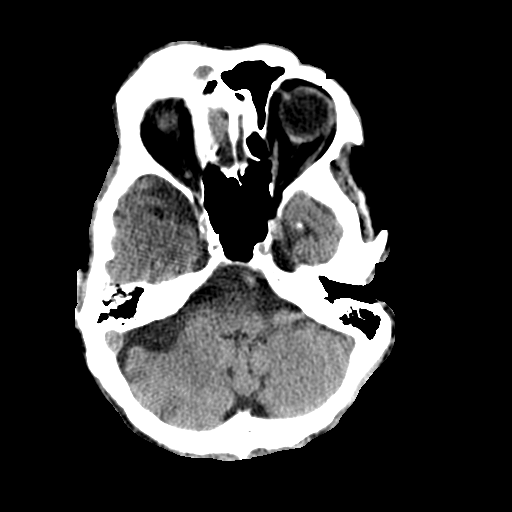
[im 6/29  bone]
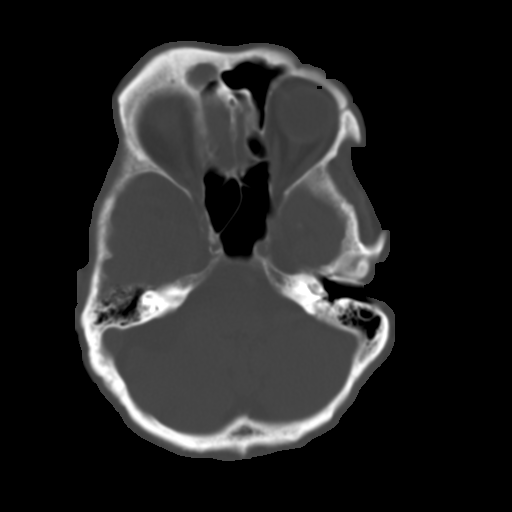
[im 12/29  brain]
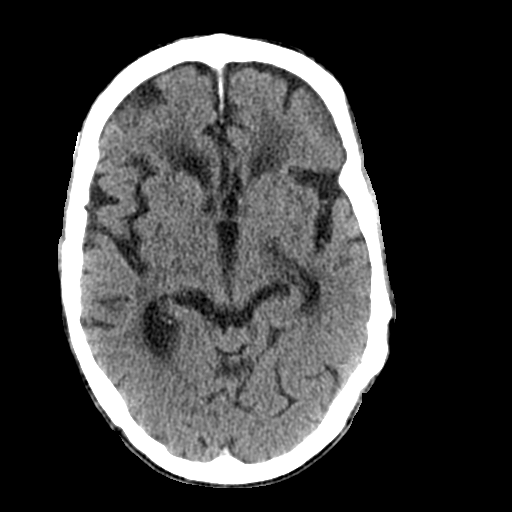
[im 17/29  brain]
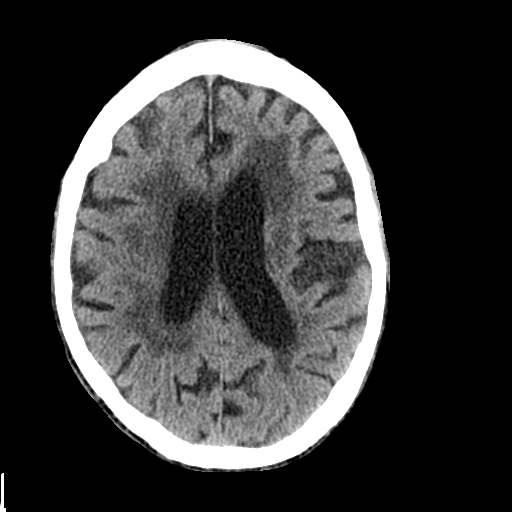
[im 23/29  brain]
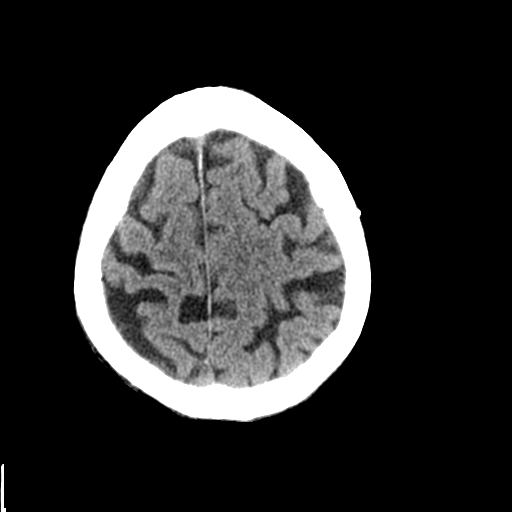

[Series 3: bone windows · axial · 0.43mm/px · z∈[-122,-14]mm · 8 of 48 slices shown]
[im 6/48  bone]
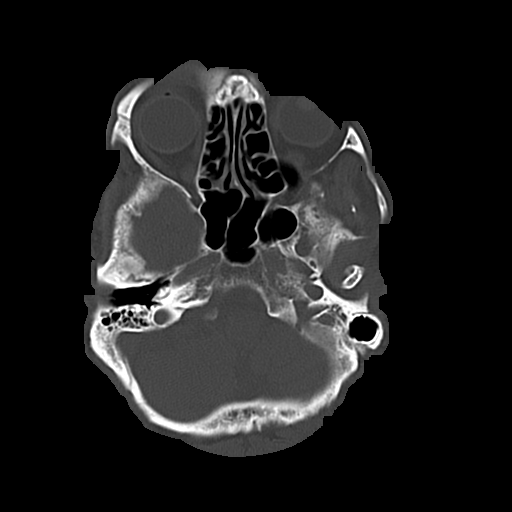
[im 11/48  bone]
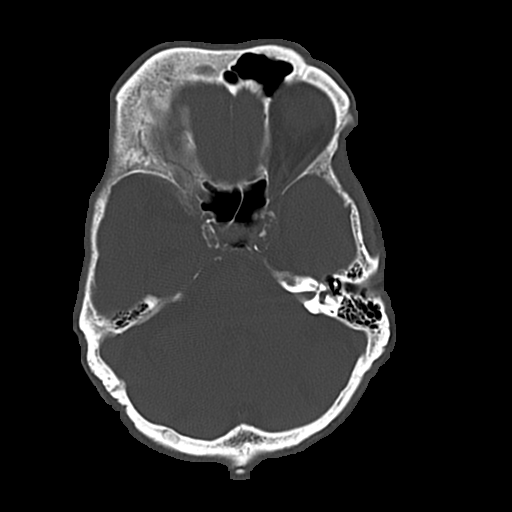
[im 16/48  bone]
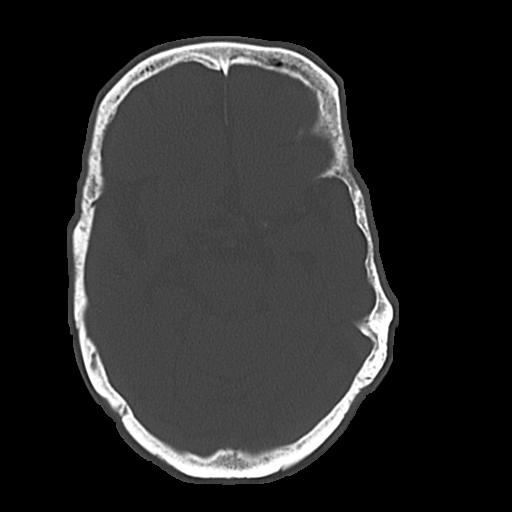
[im 21/48  bone]
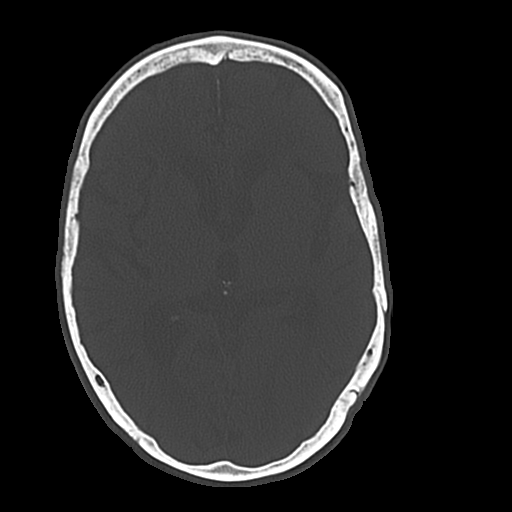
[im 27/48  bone]
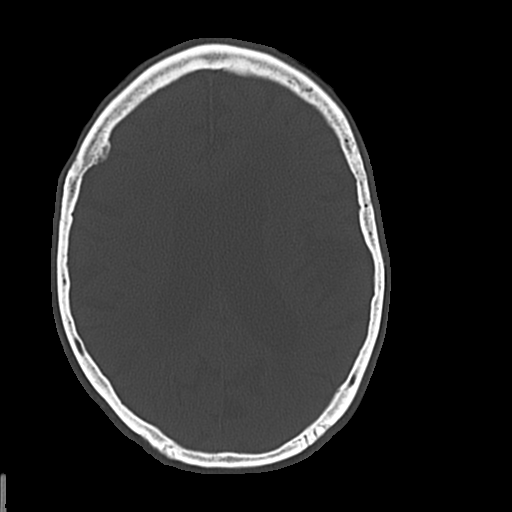
[im 32/48  bone]
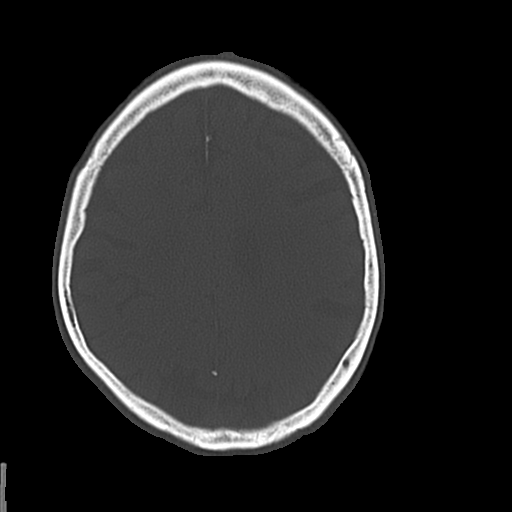
[im 37/48  bone]
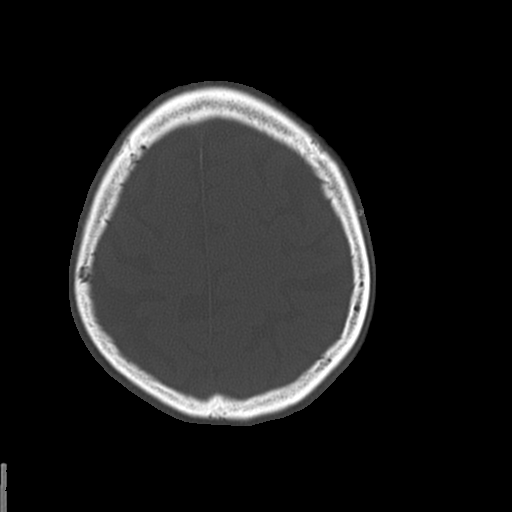
[im 42/48  bone]
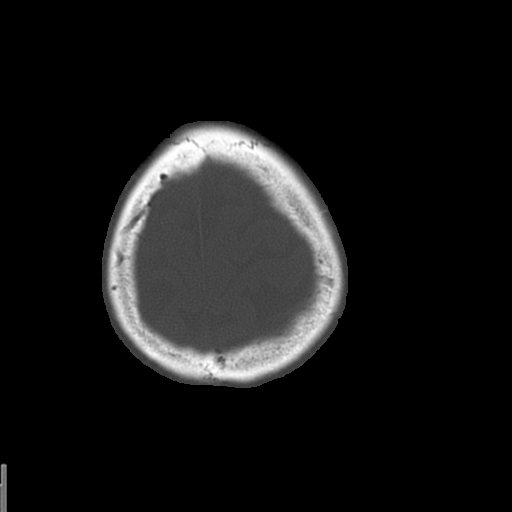

[Series 5: coronal · coronal · 0.35mm/px · 3 of 66 slices shown]
[im 22/66  brain]
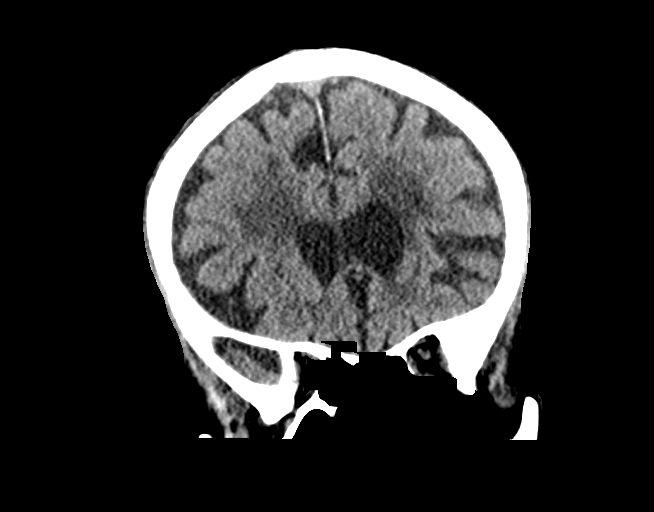
[im 29/66  brain]
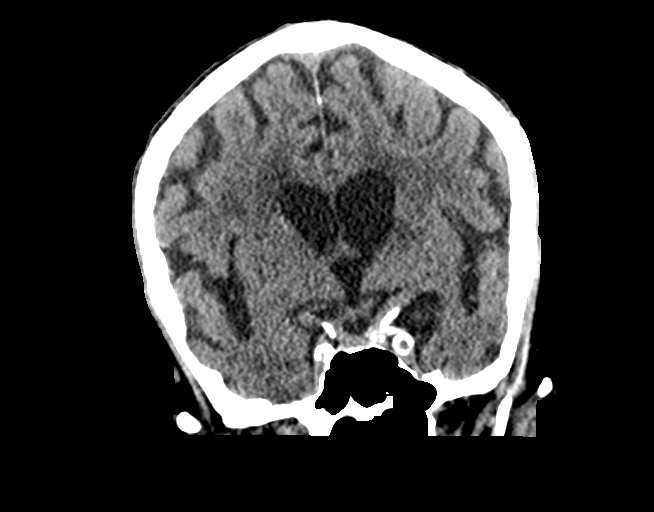
[im 37/66  brain]
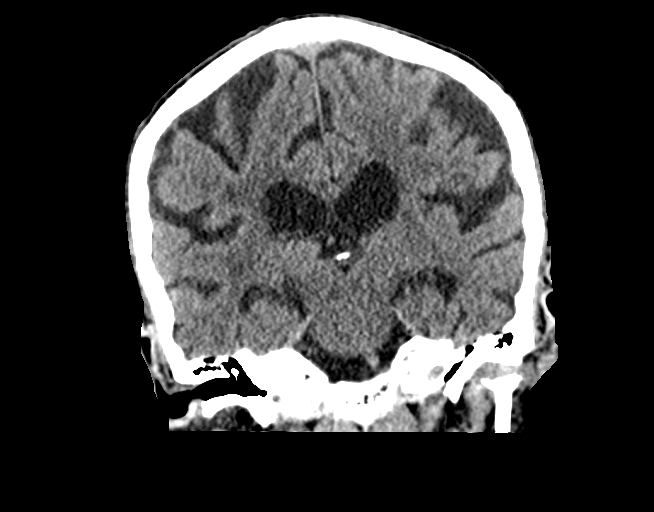

[Series 12: sagittal · sagittal · 0.33mm/px · 2 of 52 slices shown]
[im 18/52  brain]
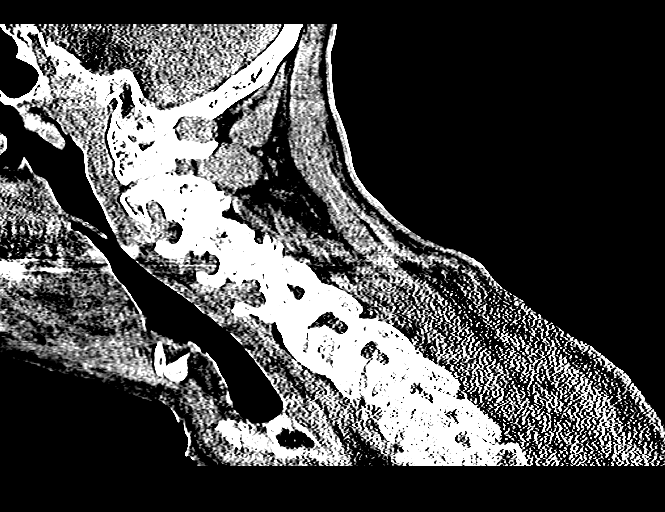
[im 35/52  brain]
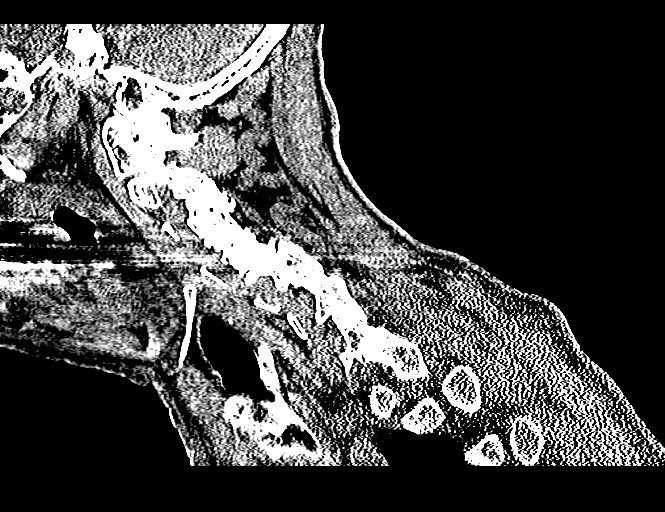

[17 of 47 positions shown; findings below may reference images not displayed]

FINDINGS: CT HEAD FINDINGS

Brain: Moderate atrophy. Extensive chronic microvascular ischemic
changes are stable. Chronic infarct left cerebellum.

Negative for acute infarct, hemorrhage, or mass lesion.

Vascular: Negative for hyperdense vessel

Skull: Negative for skull fracture. Frontal scalp hematoma and
laceration.

Sinuses/Orbits: Negative

Other: None

CT CERVICAL SPINE FINDINGS

Alignment: Normal. Accentuated kyphosis at C5-6 unchanged from the
prior study.

Skull base and vertebrae: Negative for fracture.

Soft tissues and spinal canal: Atherosclerotic disease. Negative for
mass or adenopathy

Disc levels: Degenerative arthropathy C1-2. Bilateral facet
degeneration at C2-3 and C3-4 and C4-5. Solid interbody fusion C5-6
with kyphosis.

Upper chest: Negative

Other: None
IMPRESSION: Atrophy and extensive chronic microvascular ischemia. No acute
intracranial abnormality

Frontal scalp hematoma and laceration.  Negative for skull fracture

Cervical spine degenerative changes stable from the prior study.
Negative for fracture.

## 2018-09-23 ENCOUNTER — Emergency Department (HOSPITAL_COMMUNITY)

## 2018-09-23 ENCOUNTER — Emergency Department (HOSPITAL_COMMUNITY)
Admission: EM | Admit: 2018-09-23 | Discharge: 2018-09-23 | Disposition: A | Discharge status: Home / Self Care | Attending: Emergency Medicine | Admitting: Emergency Medicine

## 2018-09-23 ENCOUNTER — Encounter (HOSPITAL_COMMUNITY): Payer: Self-pay | Admitting: Emergency Medicine

## 2018-09-23 DIAGNOSIS — S0281XA Fracture of other specified skull and facial bones, right side, initial encounter for closed fracture: Secondary | ICD-10-CM | POA: Diagnosis not present

## 2018-09-23 DIAGNOSIS — S0285XA Fracture of orbit, unspecified, initial encounter for closed fracture: Secondary | ICD-10-CM | POA: Insufficient documentation

## 2018-09-23 DIAGNOSIS — S0292XA Unspecified fracture of facial bones, initial encounter for closed fracture: Secondary | ICD-10-CM

## 2018-09-23 DIAGNOSIS — Y92129 Unspecified place in nursing home as the place of occurrence of the external cause: Secondary | ICD-10-CM | POA: Insufficient documentation

## 2018-09-23 DIAGNOSIS — I10 Essential (primary) hypertension: Secondary | ICD-10-CM | POA: Diagnosis not present

## 2018-09-23 DIAGNOSIS — Y9389 Activity, other specified: Secondary | ICD-10-CM | POA: Diagnosis not present

## 2018-09-23 DIAGNOSIS — W050XXA Fall from non-moving wheelchair, initial encounter: Secondary | ICD-10-CM | POA: Diagnosis not present

## 2018-09-23 DIAGNOSIS — S0993XA Unspecified injury of face, initial encounter: Secondary | ICD-10-CM | POA: Diagnosis present

## 2018-09-23 DIAGNOSIS — Z85828 Personal history of other malignant neoplasm of skin: Secondary | ICD-10-CM | POA: Diagnosis not present

## 2018-09-23 DIAGNOSIS — Z79899 Other long term (current) drug therapy: Secondary | ICD-10-CM | POA: Insufficient documentation

## 2018-09-23 DIAGNOSIS — F0390 Unspecified dementia without behavioral disturbance: Secondary | ICD-10-CM | POA: Diagnosis not present

## 2018-09-23 DIAGNOSIS — Y999 Unspecified external cause status: Secondary | ICD-10-CM | POA: Insufficient documentation

## 2018-09-23 DIAGNOSIS — W19XXXA Unspecified fall, initial encounter: Secondary | ICD-10-CM

## 2018-09-23 NOTE — ED Notes (Signed)
[facility] called for transportation back to the facility 

## 2018-09-23 NOTE — Discharge Instructions (Addendum)
There are some broken bones around the eye.  No bleeding in the brain however.  Particularly with the patient's hospice status does not need any acute intervention.

## 2018-09-23 NOTE — ED Notes (Signed)
[facility]:  Expected time:  Means of arrival:  Comments: EMS-fall/dementia

## 2018-09-23 NOTE — ED Triage Notes (Signed)
Per EMS-states staff witnessed patient getting out of wheelchair and falling on right side-hit head-no blood thinners, hematoma above right eye/forehead-swelling to right eye-per facility, patient is at baseline mentally
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
## 2018-09-23 NOTE — ED Notes (Signed)
[facility]

## 2018-10-21 ENCOUNTER — Other Ambulatory Visit: Payer: Self-pay

## 2018-10-21 ENCOUNTER — Emergency Department (HOSPITAL_COMMUNITY)

## 2018-10-21 ENCOUNTER — Emergency Department (HOSPITAL_COMMUNITY)
Admission: EM | Admit: 2018-10-21 | Discharge: 2018-10-21 | Disposition: A | Discharge status: Home / Self Care | Attending: Emergency Medicine | Admitting: Emergency Medicine

## 2018-10-21 ENCOUNTER — Encounter (HOSPITAL_COMMUNITY): Payer: Self-pay

## 2018-10-21 DIAGNOSIS — Y92122 Bedroom in nursing home as the place of occurrence of the external cause: Secondary | ICD-10-CM | POA: Diagnosis not present

## 2018-10-21 DIAGNOSIS — Z79899 Other long term (current) drug therapy: Secondary | ICD-10-CM | POA: Diagnosis not present

## 2018-10-21 DIAGNOSIS — Z85828 Personal history of other malignant neoplasm of skin: Secondary | ICD-10-CM | POA: Insufficient documentation

## 2018-10-21 DIAGNOSIS — W06XXXA Fall from bed, initial encounter: Secondary | ICD-10-CM | POA: Insufficient documentation

## 2018-10-21 DIAGNOSIS — Y999 Unspecified external cause status: Secondary | ICD-10-CM | POA: Diagnosis not present

## 2018-10-21 DIAGNOSIS — S0990XA Unspecified injury of head, initial encounter: Secondary | ICD-10-CM | POA: Diagnosis present

## 2018-10-21 DIAGNOSIS — N400 Enlarged prostate without lower urinary tract symptoms: Secondary | ICD-10-CM | POA: Diagnosis not present

## 2018-10-21 DIAGNOSIS — F0390 Unspecified dementia without behavioral disturbance: Secondary | ICD-10-CM | POA: Insufficient documentation

## 2018-10-21 DIAGNOSIS — S0083XA Contusion of other part of head, initial encounter: Secondary | ICD-10-CM | POA: Diagnosis not present

## 2018-10-21 DIAGNOSIS — I10 Essential (primary) hypertension: Secondary | ICD-10-CM | POA: Insufficient documentation

## 2018-10-21 DIAGNOSIS — Y939 Activity, unspecified: Secondary | ICD-10-CM | POA: Insufficient documentation

## 2018-10-21 DIAGNOSIS — W19XXXA Unspecified fall, initial encounter: Secondary | ICD-10-CM

## 2018-10-21 DIAGNOSIS — Z7982 Long term (current) use of aspirin: Secondary | ICD-10-CM | POA: Diagnosis not present

## 2018-10-21 MED ORDER — FLEET ENEMA 7-19 GM/118ML RE ENEM: 1.0000 | ENEMA | Freq: Once | RECTAL | Status: DC

## 2018-10-21 NOTE — ED Notes (Signed)
[facility] [employer] [facility] notified of need for transport of pt back to residence.  

## 2018-10-21 NOTE — ED Provider Notes (Signed)
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 and considered in my medical decision making (see chart for details).    Patient rolled out of bed, denies injury.  He is at baseline by report. No obvious trauma. Appears to have old facial abrasions.  Record review shows he had some facial fractures on CT scan last month.  CT results discussed with Dr. Cherly Hensen.  There is been interval healing of the previous orbital and frontal bone fractures.  No new fractures.  CT head and C spine stable. Pelvis without fracture but impacted stool in rectum. Unable to reach on exam. Enema given.  Patient is DNR and appears to be at his baseline. Hospice contacted. He appears stable to return to his facility.  BP (!) 152/72 (BP Location: Left Arm)   Pulse 60   Temp 98.4 F (36.9 C) (Axillary)   Resp 12   Ht 5\' 7"  (1.702 m)   Wt 52 kg   SpO2 95%   BMI 17.96 kg/m   Final Clinical Impressions(s) / ED Diagnoses   Final diagnoses:  Fall, initial encounter  Contusion of face, initial encounter    ED Discharge Orders    None       Wayland Baik, Jeannett Senior, MD 10/21/18 2155

## 2018-10-21 NOTE — Discharge Instructions (Addendum)
CT scan shows healing fractures from last month. Does show constipation and fecal impaction but no stool could be reached on exam. No new injuries.  Follow-up with your doctor.  Return to the ED with new or worsening symptoms.

## 2018-10-21 NOTE — ED Notes (Signed)
[facility]pected [facility]:  Expected time:  Means of arrival:  Comments: EMS from facility 85 yo male unwitnessed fall-rolled out of bed/dementia-no injuries-no complaints of pain

## 2018-10-21 NOTE — ED Triage Notes (Addendum)
Patient arrives by [facility] after a fall-patient is from [facility] states staff found patient on floor and they put him back to bed-unwitnessed fall-per EMS patient has no complaints and no injuries noted. Patient has dementia. Patient is a DNR.

## 2019-01-16 DEATH — deceased

## 2019-05-09 IMAGING — CT CT PELVIS W/O CM
2 of 3 series · 16 of 46 positions shown, 18 images · non-contrast
Comparison: Pelvis radiograph performed earlier today at [DATE] a.m.

CLINICAL DATA: Status post unwitnessed fall. Concern for pelvic
injury. Initial encounter.

EXAM:
CT PELVIS WITHOUT CONTRAST
TECHNIQUE: Multidetector CT imaging of the pelvis was performed following the
standard protocol without intravenous contrast.

[Series 3: axial st · axial · 0.73mm/px · z∈[+798,+1036]mm · 13 of 143 slices shown, 15 images]
[im 14/143  soft-tissue]
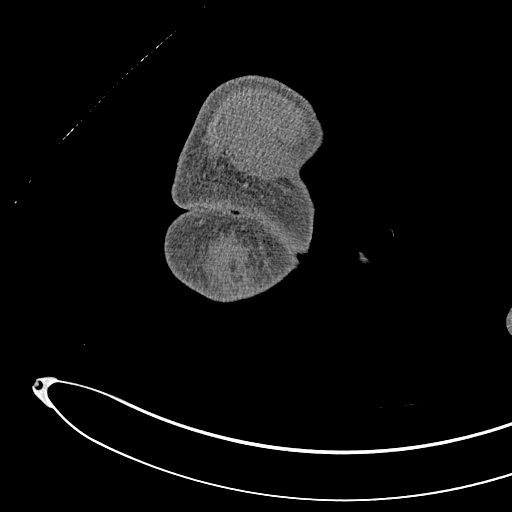
[im 14/143  bone]
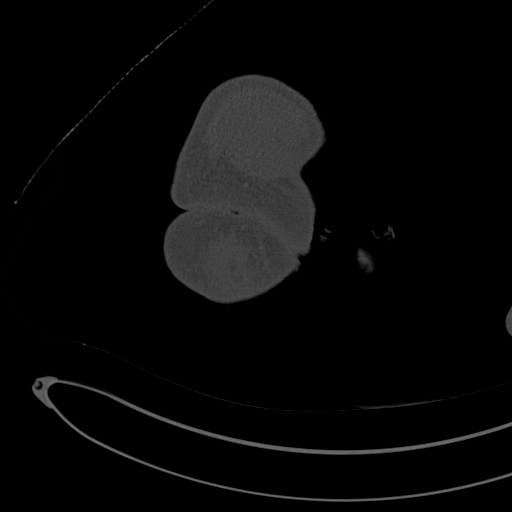
[im 23/143  soft-tissue]
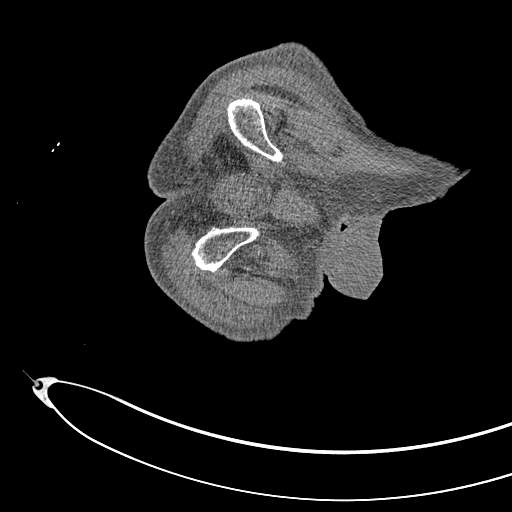
[im 33/143  soft-tissue]
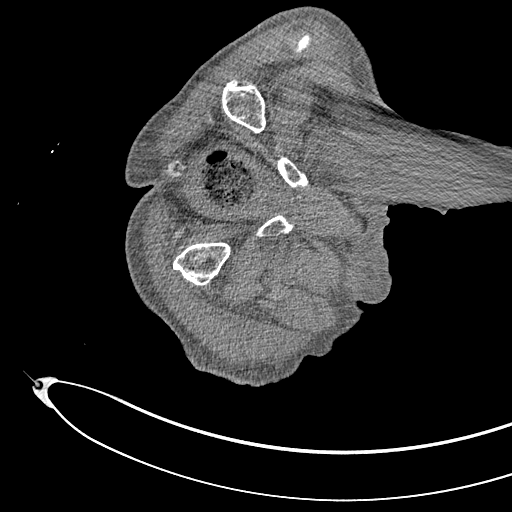
[im 42/143  soft-tissue]
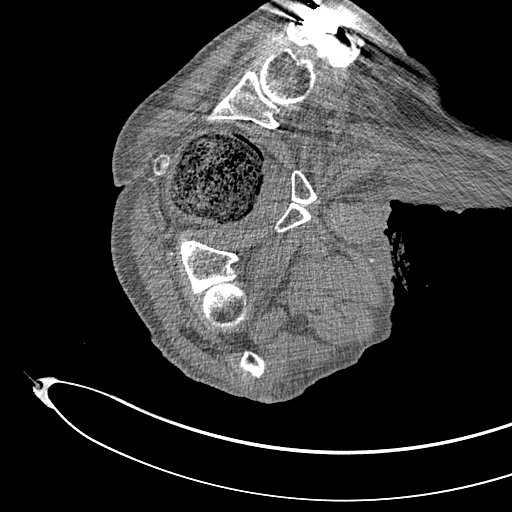
[im 55/143  soft-tissue]
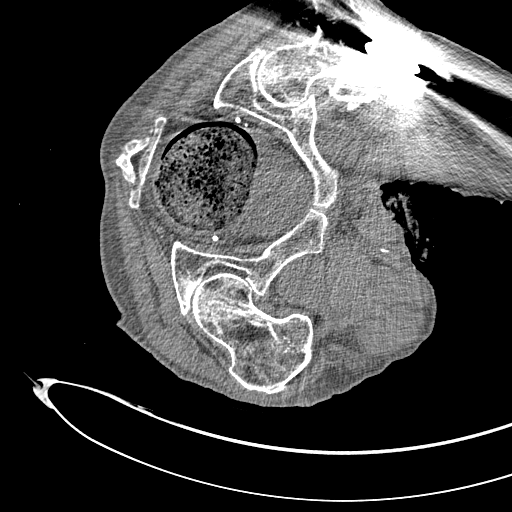
[im 65/143  soft-tissue]
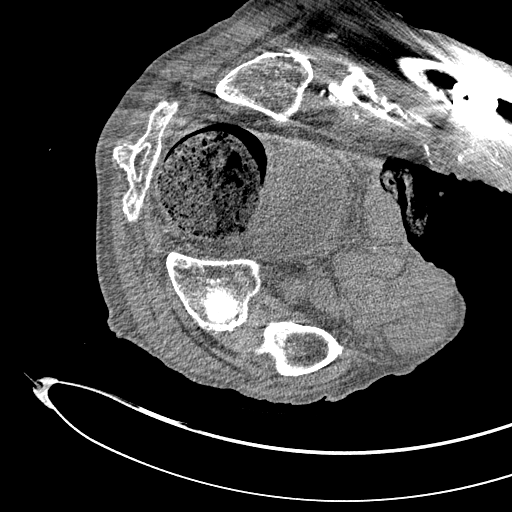
[im 74/143  soft-tissue]
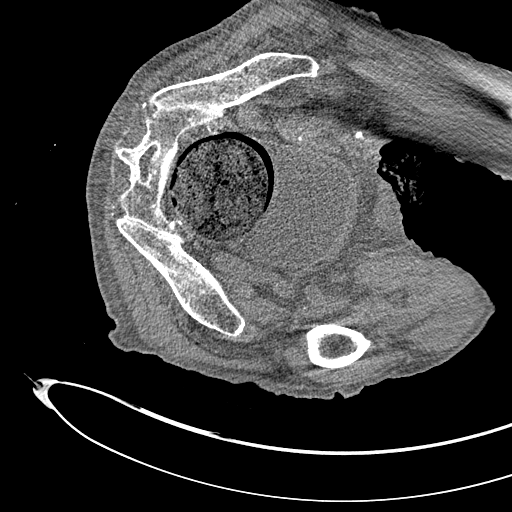
[im 83/143  soft-tissue]
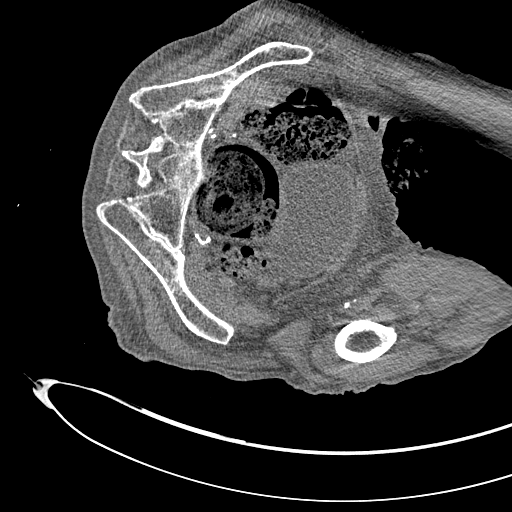
[im 92/143  soft-tissue]
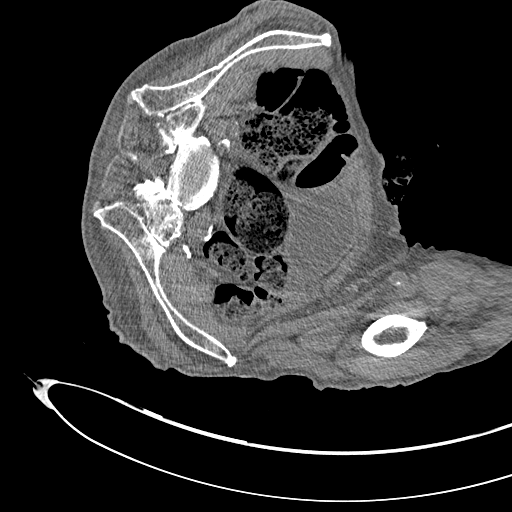
[im 92/143  bone]
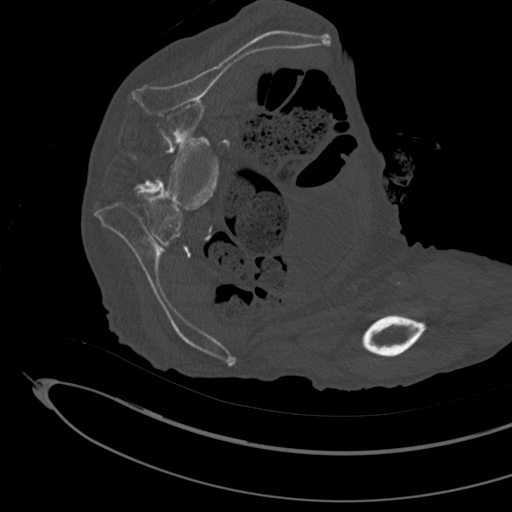
[im 106/143  soft-tissue]
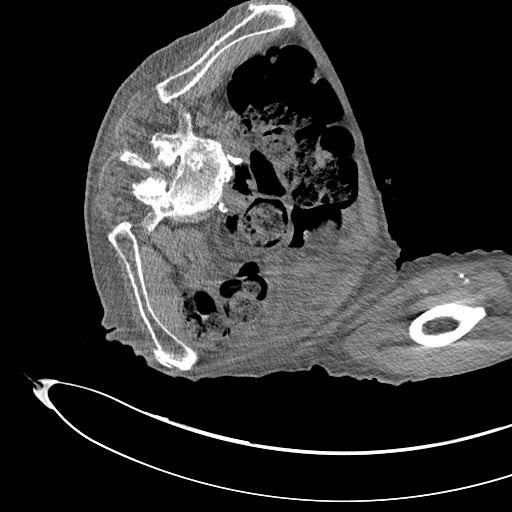
[im 115/143  soft-tissue]
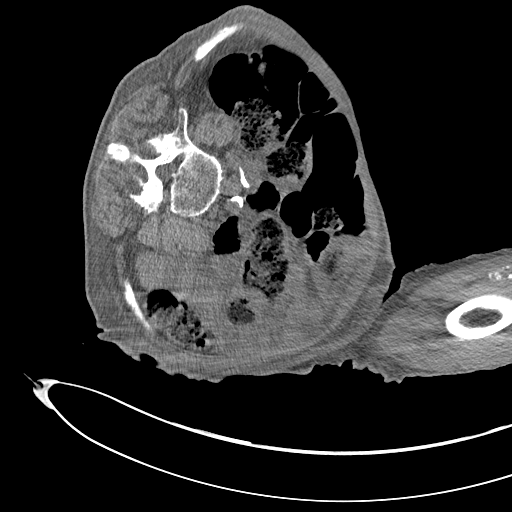
[im 124/143  soft-tissue]
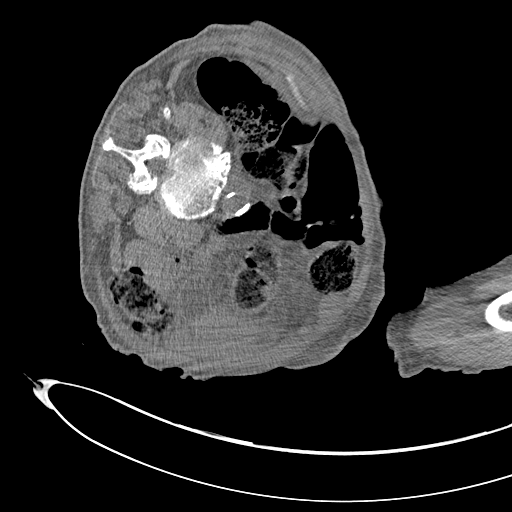
[im 133/143  soft-tissue]
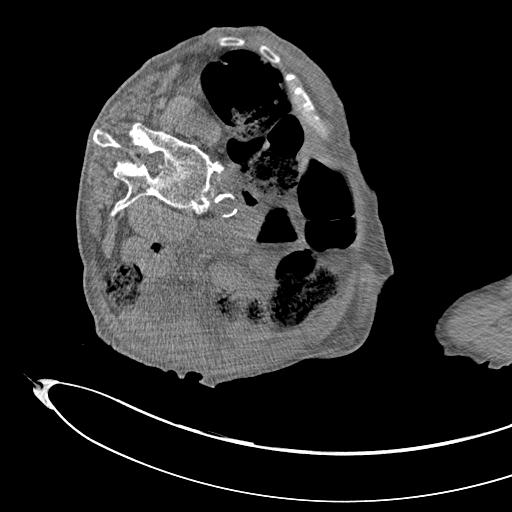

[Series 7: coronal st · coronal · 0.55mm/px · 3 of 155 slices shown]
[im 52/155  soft-tissue]
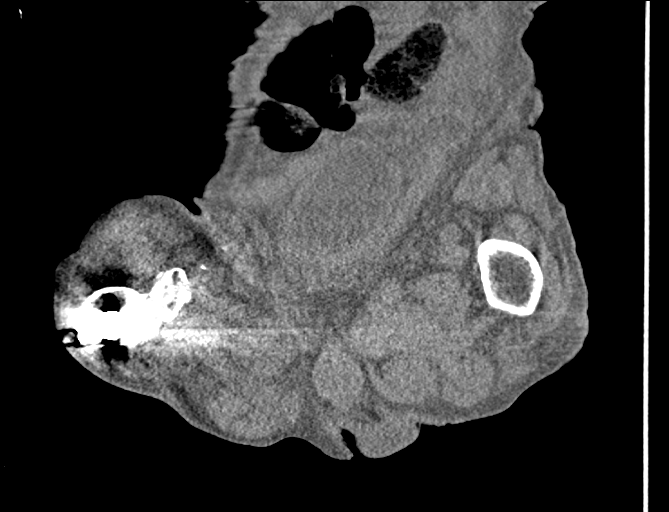
[im 69/155  soft-tissue]
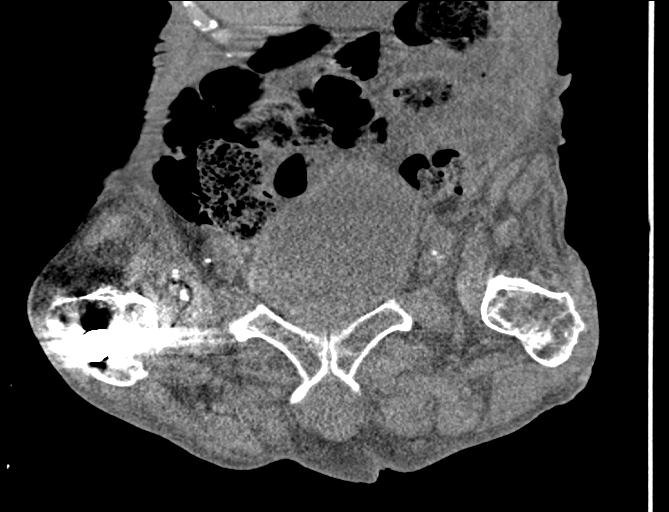
[im 86/155  soft-tissue]
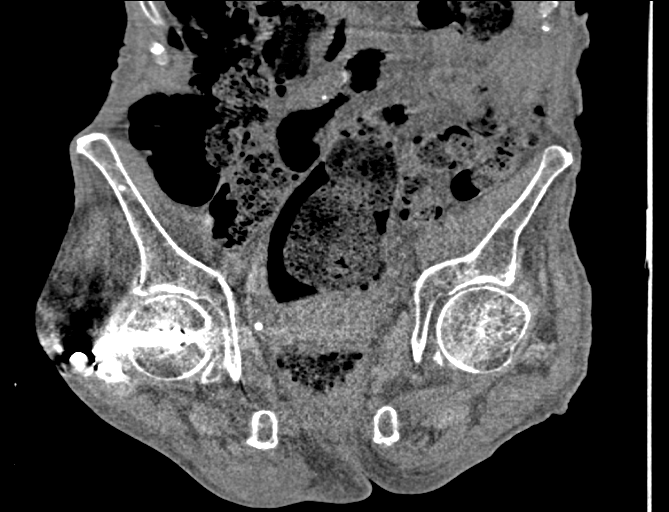

[16 of 46 positions shown; findings below may reference images not displayed]

FINDINGS: Urinary Tract: The bladder is mildly distended and grossly
unremarkable in appearance.

Bowel: The colon is largely filled with stool, with distention of
the rectum to 8.5 cm with stool, concerning for fecal impaction.
Bowel loops are difficult to fully assess due to the lack of
intraperitoneal fat.

Vascular/Lymphatic: Diffuse calcification is seen along the distal
abdominal aorta and its branches. No definite retroperitoneal or
pelvic sidewall lymphadenopathy is seen.

Reproductive: The prostate is enlarged, measuring 6.1 cm in
transverse dimension. The scrotum is grossly unremarkable, though
difficult to fully assess without contrast.

Other:  No additional soft tissue abnormalities are seen.

Musculoskeletal: There is no evidence of fracture or dislocation.
Hardware along the right femur appears intact. The femoral heads are
seated normally within respective acetabula. Facet disease is noted
at the lower lumbar spine. The visualized musculature is grossly
unremarkable.
IMPRESSION: 1. No evidence of fracture or dislocation.
2. Colon largely filled with stool, with distention of the rectum to
8.5 cm with stool, concerning for fecal impaction.
3. Enlarged prostate noted.

Aortic Atherosclerosis ([id number]).
